# Patient Record
Sex: Male | Born: 1975 | Race: Black or African American | Hispanic: No | Smoking: Never smoker
Health system: Southern US, Community
[De-identification: ages and names within clinical notes are randomized; demographics above are authoritative.]

## PROBLEM LIST (undated history)

## (undated) DIAGNOSIS — K219 Gastro-esophageal reflux disease without esophagitis: Secondary | ICD-10-CM

## (undated) DIAGNOSIS — E291 Testicular hypofunction: Secondary | ICD-10-CM

## (undated) DIAGNOSIS — R059 Cough, unspecified: Secondary | ICD-10-CM

## (undated) DIAGNOSIS — J309 Allergic rhinitis, unspecified: Secondary | ICD-10-CM

## (undated) DIAGNOSIS — J45909 Unspecified asthma, uncomplicated: Secondary | ICD-10-CM

## (undated) DIAGNOSIS — R05 Cough: Secondary | ICD-10-CM

## (undated) DIAGNOSIS — D649 Anemia, unspecified: Secondary | ICD-10-CM

## (undated) DIAGNOSIS — R519 Headache, unspecified: Secondary | ICD-10-CM

## (undated) DIAGNOSIS — G8929 Other chronic pain: Secondary | ICD-10-CM

## (undated) DIAGNOSIS — R0600 Dyspnea, unspecified: Secondary | ICD-10-CM

## (undated) DIAGNOSIS — I10 Essential (primary) hypertension: Secondary | ICD-10-CM

## (undated) DIAGNOSIS — Z79899 Other long term (current) drug therapy: Secondary | ICD-10-CM

## (undated) DIAGNOSIS — N529 Male erectile dysfunction, unspecified: Secondary | ICD-10-CM

## (undated) DIAGNOSIS — R51 Headache: Secondary | ICD-10-CM

## (undated) DIAGNOSIS — E8881 Metabolic syndrome: Secondary | ICD-10-CM

## (undated) HISTORY — PX: EYE SURGERY: SHX253

## (undated) HISTORY — DX: Other chronic pain: G89.29

## (undated) HISTORY — DX: Male erectile dysfunction, unspecified: N52.9

## (undated) HISTORY — DX: Testicular hypofunction: E29.1

## (undated) HISTORY — DX: Headache, unspecified: R51.9

## (undated) HISTORY — DX: Cough, unspecified: R05.9

## (undated) HISTORY — DX: Unspecified asthma, uncomplicated: J45.909

## (undated) HISTORY — DX: Headache: R51

## (undated) HISTORY — DX: Gastro-esophageal reflux disease without esophagitis: K21.9

## (undated) HISTORY — DX: Metabolic syndrome: E88.81

## (undated) HISTORY — DX: Other long term (current) drug therapy: Z79.899

## (undated) HISTORY — DX: Dyspnea, unspecified: R06.00

## (undated) HISTORY — DX: Cough: R05

## (undated) HISTORY — DX: Anemia, unspecified: D64.9

## (undated) HISTORY — DX: Allergic rhinitis, unspecified: J30.9

## (undated) HISTORY — DX: Essential (primary) hypertension: I10

## (undated) HISTORY — DX: Metabolic syndrome: E88.810

## (undated) HISTORY — PX: WISDOM TOOTH EXTRACTION: SHX21

---

## 1999-06-09 ENCOUNTER — Emergency Department (HOSPITAL_COMMUNITY): Admission: EM | Admit: 1999-06-09 | Discharge: 1999-06-09 | Payer: Self-pay | Admitting: Emergency Medicine

## 2014-02-14 ENCOUNTER — Encounter: Payer: Self-pay | Admitting: Critical Care Medicine

## 2014-02-14 ENCOUNTER — Ambulatory Visit (INDEPENDENT_AMBULATORY_CARE_PROVIDER_SITE_OTHER): Payer: Self-pay | Admitting: Critical Care Medicine

## 2014-02-14 VITALS — BP 130/70 | HR 72 | Temp 98.4°F | Ht 68.0 in | Wt 215.0 lb

## 2014-02-14 DIAGNOSIS — J302 Other seasonal allergic rhinitis: Secondary | ICD-10-CM

## 2014-02-14 DIAGNOSIS — K219 Gastro-esophageal reflux disease without esophagitis: Secondary | ICD-10-CM | POA: Insufficient documentation

## 2014-02-14 DIAGNOSIS — R059 Cough, unspecified: Secondary | ICD-10-CM | POA: Insufficient documentation

## 2014-02-14 DIAGNOSIS — R0600 Dyspnea, unspecified: Secondary | ICD-10-CM

## 2014-02-14 DIAGNOSIS — J4541 Moderate persistent asthma with (acute) exacerbation: Secondary | ICD-10-CM

## 2014-02-14 DIAGNOSIS — J309 Allergic rhinitis, unspecified: Secondary | ICD-10-CM | POA: Insufficient documentation

## 2014-02-14 DIAGNOSIS — R05 Cough: Secondary | ICD-10-CM

## 2014-02-14 DIAGNOSIS — J4540 Moderate persistent asthma, uncomplicated: Secondary | ICD-10-CM | POA: Insufficient documentation

## 2014-02-14 MED ORDER — FLUTICASONE PROPIONATE 50 MCG/ACT NA SUSP: 2.0000 | Freq: Every day | NASAL | Status: DC

## 2014-02-14 MED ORDER — MOMETASONE FUROATE 220 MCG/INH IN AEPB: 2.0000 | INHALATION_SPRAY | Freq: Every day | RESPIRATORY_TRACT | Status: DC

## 2014-02-14 MED ORDER — ESOMEPRAZOLE MAGNESIUM 40 MG PO CPDR: 40.0000 mg | DELAYED_RELEASE_CAPSULE | Freq: Every day | ORAL | Status: DC

## 2014-02-14 NOTE — Assessment & Plan Note (Signed)
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

## 2014-02-14 NOTE — Assessment & Plan Note (Signed)
moder persistent allergic rhinitis  Start flonase daily

## 2014-02-14 NOTE — Patient Instructions (Signed)
Start reflux diet  Take nexium 40mg  daily before breakfast Start fluticasone nasal two puff ea nostril daily Start Asmanex two puff daily Return 2 months Send me an MSD sheet on the  plastic material with which you do your work

## 2014-02-14 NOTE — Progress Notes (Signed)
Subjective:    Patient ID: Brad Barnett, male    DOB: [birth year], 38 y.o.   MRN: [id number]  HPI Comments: Chronic cough x 5 months. Pcp rx abx and steroids.  No real improvement.  Maybe sl better with cooler weather.  Works indoors in [employer] 500 degrees heater, dust and fumes.  Pt inhales plastic fumes. Works there x 15 yrs.  Cough This is a recurrent (cough comes and goes x 69yrs, worse this year x 5 months) problem. The current episode started more than 1 month ago. The problem has been gradually improving (with cooler weather). Episode frequency: now coughs at night, before was coughing every few minutes including daytime. The cough is non-productive. Associated symptoms include eye redness, headaches, heartburn, a sore throat, shortness of breath and wheezing. Pertinent negatives include no chest pain, chills, ear congestion, ear pain, fever, hemoptysis, myalgias, nasal congestion, postnasal drip, rash, rhinorrhea or weight loss. Associated symptoms comments: Notes some chest tightness. The symptoms are aggravated by fumes, exercise and lying down (when took a week off no cough at all, worse at night). Risk factors for lung disease include occupational exposure. He has tried oral steroids (abx ) for the symptoms. The treatment provided no relief. There is no history of asthma, bronchiectasis, bronchitis, COPD, emphysema, environmental allergies or pneumonia.    Past Medical History  Diagnosis Date  . Allergic rhinitis   . ED (erectile dysfunction)   . GERD (gastroesophageal reflux disease)   . Headache   . High risk medication use   . Hypogonadism male   . Metabolic syndrome   . Cough   . Dyspnea      Family History  Problem Relation Age of Onset  . Heart disease Father   . Breast cancer Paternal Aunt   . Hypertension Mother   . Diabetes Paternal Grandmother      History   Social History  . Marital Status: Married    Spouse Name: N/A    Number of Children: N/A    . Years of Education: N/A   Occupational History  . Process Music therapist   Social History Main Topics  . Smoking status: Never Smoker   . Smokeless tobacco: Never Used  . Alcohol Use: No  . Drug Use: No  . Sexual Activity: Not on file   Other Topics Concern  . Not on file   Social History Narrative  . No narrative on file     No Known Allergies   No outpatient prescriptions prior to visit.   No facility-administered medications prior to visit.      Review of Systems  Constitutional: Positive for fatigue. Negative for fever, chills and weight loss.  HENT: Positive for sneezing and sore throat. Negative for congestion, dental problem, drooling, ear pain, mouth sores, nosebleeds, postnasal drip, rhinorrhea, sinus pressure, trouble swallowing and voice change.   Eyes: Positive for redness. Negative for pain and itching.  Respiratory: Positive for cough, chest tightness, shortness of breath and wheezing. Negative for apnea, hemoptysis, choking and stridor.   Cardiovascular: Negative for chest pain and palpitations.  Gastrointestinal: Positive for heartburn.       Notes heart burn daily  Musculoskeletal: Negative for myalgias.  Skin: Negative for rash.  Allergic/Immunologic: Negative for environmental allergies.  Neurological: Positive for headaches.       Objective:   Physical Exam Filed Vitals:   02/14/14 0921  BP: 130/70  Pulse: 72  Temp: 98.4 F (36.9 C)  TempSrc: Oral  Height: 5\' 8"  (1.727 m)  Weight: 215 lb (97.523 kg)  SpO2: 97%    Gen: Pleasant, well-nourished, in no distress,  normal affect  ENT: No lesions,  mouth clear,  oropharynx clear, ++postnasal drip, nasal inflammation.    Neck: No JVD, no TMG, no carotid bruits  Lungs: No use of accessory muscles, no dullness to percussion, expir wheeze  Cardiovascular: RRR, heart sounds normal, no murmur or gallops, no peripheral edema  Abdomen: soft and NT, no HSM,  BS  normal  Musculoskeletal: No deformities, no cyanosis or clubbing  Neuro: alert, non focal  Skin: Warm, no lesions or rashes  No results found.  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 esomeprazole (NEXIUM) 40 MG capsule Take 1 capsule (40 mg total) by mouth daily before breakfast.  . fluticasone (FLONASE) 50 MCG/ACT nasal spray Place 2 sprays into both nostrils daily.  . mometasone (ASMANEX 120 METERED DOSES) 220 MCG/INH inhaler Inhale 2 puffs into the lungs daily.  . [DISCONTINUED] esomeprazole (NEXIUM) 40 MG capsule Take 1 capsule by mouth daily as needed.

## 2014-03-27 ENCOUNTER — Encounter: Payer: Self-pay | Admitting: Critical Care Medicine

## 2014-04-11 ENCOUNTER — Ambulatory Visit: Payer: Self-pay | Admitting: Critical Care Medicine

## 2014-05-17 ENCOUNTER — Ambulatory Visit (INDEPENDENT_AMBULATORY_CARE_PROVIDER_SITE_OTHER): Payer: Self-pay | Admitting: Critical Care Medicine

## 2014-05-17 ENCOUNTER — Encounter: Payer: Self-pay | Admitting: Critical Care Medicine

## 2014-05-17 VITALS — BP 134/86 | HR 72 | Temp 99.1°F | Ht 68.5 in | Wt 215.2 lb

## 2014-05-17 DIAGNOSIS — J4540 Moderate persistent asthma, uncomplicated: Secondary | ICD-10-CM

## 2014-05-17 NOTE — Progress Notes (Signed)
Subjective:    Patient ID: Brad Barnett, male    DOB: February 12, 1976, 39 y.o.   MRN: [id number]  HPI Comments: Chronic cough x 5 months. Pcp rx abx and steroids.  No real improvement.  Maybe sl better with cooler weather.  Works indoors in [employer] 500 degrees heater, dust and fumes.  Pt inhales plastic fumes. Works there x 15 yrs.  Cough Pertinent negatives include no chest pain, chills, ear pain, eye redness, fever, headaches, myalgias, postnasal drip, rash, rhinorrhea, sore throat, shortness of breath or wheezing. There is no history of environmental allergies.   05/17/2014 Chief Complaint  Patient presents with  . 2 month follow up    Overall, breathing doing well at this time.  Cough has resolved.  No SOB, wheezing, or chest tightness/CP.  Overall this patient's markedly better since the last office visit. The patient maintains Asmanex daily. Pt notes mild dyspnea. Cough is better.  Pt did have a 3 week episode around thanksgiving.  Pt on nexium and asmanex , also using nasal spray Pt denies any significant sore throat, nasal congestion or excess secretions, fever, chills, sweats, unintended weight loss, pleurtic or exertional chest pain, orthopnea PND, or leg swelling Pt denies any increase in rescue therapy over baseline, denies waking up needing it or having any early am or nocturnal exacerbations of coughing/wheezing/or dyspnea. Pt also denies any obvious fluctuation in symptoms with  weather or environmental change or other alleviating or aggravating factors  PUL ASTHMA HISTORY 05/17/2014  Symptoms 0-2 days/week  Nighttime awakenings 3-4/month  Interference with activity Minor limitations  SABA use 0-2 days/wk  Exacerbations requiring oral steroids 0-1 / year     Review of Systems  Constitutional: Negative for fever, chills and fatigue.  HENT: Negative for congestion, dental problem, drooling, ear pain, mouth sores, nosebleeds, postnasal drip, rhinorrhea, sinus  pressure, sneezing, sore throat, trouble swallowing and voice change.   Eyes: Negative for pain, redness and itching.  Respiratory: Negative for apnea, cough, choking, chest tightness, shortness of breath, wheezing and stridor.   Cardiovascular: Negative for chest pain and palpitations.  Gastrointestinal:       Heartburn has improved  Musculoskeletal: Negative for myalgias.  Skin: Negative for rash.  Allergic/Immunologic: Negative for environmental allergies.  Neurological: Negative for headaches.       Objective:   Physical Exam Filed Vitals:   05/17/14 0951  BP: 134/86  Pulse: 72  Temp: 99.1 F (37.3 C)  TempSrc: Oral  Height: 5' 8.5" (1.74 m)  Weight: 215 lb 3.2 oz (97.614 kg)  SpO2: 99%    Gen: Pleasant, well-nourished, in no distress,  normal affect  ENT: No lesions,  mouth clear,  oropharynx clear, - postnasal drip,no nasal inflammation.    Neck: No JVD, no TMG, no carotid bruits  Lungs: No use of accessory muscles, no dullness to percussion, no wheezing  Cardiovascular: RRR, heart sounds normal, no murmur or gallops, no peripheral edema  Abdomen: soft and NT, no HSM,  BS normal  Musculoskeletal: No deformities, no cyanosis or clubbing  Neuro: alert, non focal  Skin: Warm, no lesions or rashes  No results found.      Assessment & Plan:   Asthma, moderate persistent Moderate persistent asthma with improved airway function with use of inhaled steroid and control of reflux Plan Maintain Asmanex daily Maintain Nexium daily Continued short acting beta agonist as needed Continue nasal steroid     Updated Medication List Outpatient Encounter Prescriptions as of 05/17/2014  Medication Sig  . esomeprazole (NEXIUM) 40 MG capsule Take 1 capsule (40 mg total) by mouth daily before breakfast.  . fluticasone (FLONASE) 50 MCG/ACT nasal spray Place 2 sprays into both nostrils daily.  . mometasone (ASMANEX 120 METERED DOSES) 220 MCG/INH inhaler Inhale 2 puffs  into the lungs daily.

## 2014-05-17 NOTE — Patient Instructions (Signed)
Stay on asmanex , nexium and flonase Return 4 months

## 2014-05-18 NOTE — Assessment & Plan Note (Signed)
Moderate persistent asthma with improved airway function with use of inhaled steroid and control of reflux Plan Maintain Asmanex daily Maintain Nexium daily Continued short acting beta agonist as needed Continue nasal steroid

## 2014-05-30 ENCOUNTER — Ambulatory Visit: Payer: Self-pay | Admitting: Critical Care Medicine

## 2015-04-04 HISTORY — PX: COLONOSCOPY: SHX174

## 2016-02-21 ENCOUNTER — Ambulatory Visit: Payer: Self-pay | Admitting: Adult Health

## 2016-02-26 ENCOUNTER — Ambulatory Visit (INDEPENDENT_AMBULATORY_CARE_PROVIDER_SITE_OTHER): Payer: BLUE CROSS/BLUE SHIELD | Admitting: Adult Health

## 2016-02-26 ENCOUNTER — Other Ambulatory Visit (INDEPENDENT_AMBULATORY_CARE_PROVIDER_SITE_OTHER): Payer: BLUE CROSS/BLUE SHIELD

## 2016-02-26 ENCOUNTER — Encounter: Payer: Self-pay | Admitting: Adult Health

## 2016-02-26 ENCOUNTER — Ambulatory Visit (INDEPENDENT_AMBULATORY_CARE_PROVIDER_SITE_OTHER)
Admission: RE | Admit: 2016-02-26 | Discharge: 2016-02-26 | Disposition: A | Discharge status: Home / Self Care | Payer: BLUE CROSS/BLUE SHIELD | Source: Ambulatory Visit | Attending: Adult Health | Admitting: Adult Health

## 2016-02-26 VITALS — BP 130/78 | HR 84 | Temp 98.1°F | Ht 69.0 in | Wt 214.6 lb

## 2016-02-26 DIAGNOSIS — R05 Cough: Secondary | ICD-10-CM | POA: Diagnosis not present

## 2016-02-26 DIAGNOSIS — J4541 Moderate persistent asthma with (acute) exacerbation: Secondary | ICD-10-CM

## 2016-02-26 DIAGNOSIS — R059 Cough, unspecified: Secondary | ICD-10-CM

## 2016-02-26 LAB — CBC WITH DIFFERENTIAL/PLATELET
BASOS ABS: 0 10*3/uL (ref 0.0–0.1)
Basophils Relative: 0.3 % (ref 0.0–3.0)
EOS PCT: 0.2 % (ref 0.0–5.0)
Eosinophils Absolute: 0 10*3/uL (ref 0.0–0.7)
HCT: 42.8 % (ref 39.0–52.0)
HEMOGLOBIN: 14.4 g/dL (ref 13.0–17.0)
LYMPHS PCT: 12.4 % (ref 12.0–46.0)
Lymphs Abs: 1.1 10*3/uL (ref 0.7–4.0)
MCHC: 33.6 g/dL (ref 30.0–36.0)
MCV: 94.8 fl (ref 78.0–100.0)
Monocytes Absolute: 0.9 10*3/uL (ref 0.1–1.0)
Monocytes Relative: 9.9 % (ref 3.0–12.0)
Neutro Abs: 6.7 10*3/uL (ref 1.4–7.7)
Neutrophils Relative %: 77.2 % — ABNORMAL HIGH (ref 43.0–77.0)
Platelets: 208 10*3/uL (ref 150.0–400.0)
RBC: 4.52 Mil/uL (ref 4.22–5.81)
RDW: 12.9 % (ref 11.5–15.5)
WBC: 8.7 10*3/uL (ref 4.0–10.5)

## 2016-02-26 LAB — NITRIC OXIDE: NITRIC OXIDE: 14

## 2016-02-26 MED ORDER — MOMETASONE FUROATE 220 MCG/INH IN AEPB: 2.0000 | INHALATION_SPRAY | Freq: Every day | RESPIRATORY_TRACT | 4 refills | Status: DC

## 2016-02-26 NOTE — Patient Instructions (Addendum)
Restart Asmanex daily rinse well after use.  Begin Zyrtec 10mg  At bedtime  .  Begin Delsym 2 tsp Twice daily  As needed  Cough.  Begin Pepcid 20mg  At bedtime  .  Check chest xray and labs today  Follow up Dr. [name]  In 6 weeks with PFT and As needed   Please contact office for sooner follow up if symptoms do not improve or worsen or seek emergency care

## 2016-02-26 NOTE — Progress Notes (Signed)
Subjective:    Patient ID: Brad Barnett, male    DOB: [birth year], 40 y.o.   MRN: [id number]  HPI 40 year old male never smoker followed for moderate persistent asthma with chronic cough.  02/26/2016 Acute OV  Patient presents for an acute office visit. Patient complains over the last 3-4 weeks that he's had increased cough that is mainly dry. His last seen in the office in greater than 1-1/2 years ago. Previously treated with Asmanex and Nexium /Flonase.  Says cough got better but never went away totally . Has been off asmanex x 3 months  Not using any cough meds  FeNO 14 today .  Previous spirometry normal in 2015.  He denies any chest pain, orthopnea, PND, leg swelling, hemoptysis, rash, abdominal pain, nausea, vomiting, diarrhea, or overt reflux   Past Medical History:  Diagnosis Date  . Allergic rhinitis   . Cough   . Dyspnea   . ED (erectile dysfunction)   . GERD (gastroesophageal reflux disease)   . Headache   . High risk medication use   . Hypogonadism male   . Metabolic syndrome    Current Outpatient Prescriptions on File Prior to Visit  Medication Sig Dispense Refill  . esomeprazole (NEXIUM) 40 MG capsule Take 1 capsule (40 mg total) by mouth daily before breakfast. 30 capsule 6  . fluticasone (FLONASE) 50 MCG/ACT nasal spray Place 2 sprays into both nostrils daily. (Patient not taking: Reported on 02/26/2016) 16 g 6  . mometasone (ASMANEX 120 METERED DOSES) 220 MCG/INH inhaler Inhale 2 puffs into the lungs daily. (Patient not taking: Reported on 02/26/2016) 1 Inhaler 12   No current facility-administered medications on file prior to visit.      Review of Systems Constitutional:   No  weight loss, night sweats,  Fevers, chills, fatigue, or  lassitude.  HEENT:   No headaches,  Difficulty swallowing,  Tooth/dental problems, or  Sore throat,                No sneezing, itching, ear ache, + nasal congestion, post nasal drip,   CV:  No chest pain,  Orthopnea,  PND, swelling in lower extremities, anasarca, dizziness, palpitations, syncope.   GI  No heartburn, indigestion, abdominal pain, nausea, vomiting, diarrhea, change in bowel habits, loss of appetite, bloody stools.   Resp:   No chest wall deformity  Skin: no rash or lesions.  GU: no dysuria, change in color of urine, no urgency or frequency.  No flank pain, no hematuria   MS:  No joint pain or swelling.  No decreased range of motion.  No back pain.  Psych:  No change in mood or affect. No depression or anxiety.  No memory loss.         Objective:   Physical Exam Vitals:   02/26/16 1612  BP: 130/78  Pulse: 84  Temp: 98.1 F (36.7 C)  TempSrc: Oral  SpO2: 98%  Weight: 214 lb 9.6 oz (97.3 kg)  Height: 5\' 9"  (1.753 m)   GEN: A/Ox3; pleasant , NAD, well nourished    HEENT:  [place]/AT,  EACs-clear, TMs-wnl, NOSE-clear drainage , THROAT-clear, no lesions, no postnasal drip or exudate noted.   NECK:  Supple w/ fair ROM; no JVD; normal carotid impulses w/o bruits; no thyromegaly or nodules palpated; no lymphadenopathy.    RESP  Clear  P & A; w/o, wheezes/ rales/ or rhonchi. no accessory muscle use, no dullness to percussion  CARD:  RRR, no m/r/g  , no  peripheral edema, pulses intact, no cyanosis or clubbing.  GI:   Soft & nt; nml bowel sounds; no organomegaly or masses detected.   Musco: Warm bil, no deformities or joint swelling noted.   Neuro: alert, no focal deficits noted.    Skin: Warm, no lesions or rashes  Tammy Parrett NP-C  [facility]  02/26/2016        Assessment & Plan:

## 2016-02-27 LAB — RESPIRATORY ALLERGY PROFILE REGION II ~~LOC~~
Allergen, A. alternata, m6: <0.1 kU/L
Allergen, C. Herbarum, M2: <0.1 kU/L
Allergen, Comm Silver Birch, t9: <0.1 kU/L
Allergen, Cottonwood, t14: <0.1 kU/L
Allergen, D pternoyssinus,d7: 3.06 kU/L — ABNORMAL HIGH
Allergen, Mulberry, t76: <0.1 kU/L
Allergen, P. notatum, m1: <0.1 kU/L
Bermuda Grass: <0.1 kU/L
Cockroach: <0.1 kU/L
Common Ragweed: <0.1 kU/L
D. FARINAE: 3.05 kU/L — AB
Dog Dander: <0.1 kU/L
Elm IgE: <0.1 kU/L
IgE (Immunoglobulin E), Serum: 86 kU/L (ref <115)
Pecan/Hickory Tree IgE: <0.1 kU/L
Rough Pigweed  IgE: <0.1 kU/L

## 2016-02-28 NOTE — Progress Notes (Signed)
Chart and office note reviewed in detail  > agree with a/p as outlined    

## 2016-02-28 NOTE — Assessment & Plan Note (Signed)
Cough flare ? AR/GERD triggers  Check cxr Reva Bores w/ IgE and RAST   Plan  Patient Instructions  Restart Asmanex daily rinse well after use.  Begin Zyrtec 10mg  At bedtime  .  Begin Delsym 2 tsp Twice daily  As needed  Cough.  Begin Pepcid 20mg  At bedtime  .  Check chest xray and labs today  Follow up Dr. [name]  In 6 weeks with PFT and As needed   Please contact office for sooner follow up if symptoms do not improve or worsen or seek emergency care

## 2016-02-28 NOTE — Assessment & Plan Note (Signed)
Mild flare -check IgE /RAST  FeNO low  Prev spirometry nml   Plan  Patient Instructions  Restart Asmanex daily rinse well after use.  Begin Zyrtec 10mg  At bedtime  .  Begin Delsym 2 tsp Twice daily  As needed  Cough.  Begin Pepcid 20mg  At bedtime  .  Check chest xray and labs today  Follow up Dr. [name]  In 6 weeks with PFT and As needed   Please contact office for sooner follow up if symptoms do not improve or worsen or seek emergency care

## 2016-04-16 ENCOUNTER — Other Ambulatory Visit: Payer: Self-pay | Admitting: Internal Medicine

## 2016-04-16 ENCOUNTER — Ambulatory Visit: Payer: BLUE CROSS/BLUE SHIELD | Admitting: Internal Medicine

## 2016-04-16 DIAGNOSIS — R0600 Dyspnea, unspecified: Secondary | ICD-10-CM

## 2016-05-19 ENCOUNTER — Ambulatory Visit: Payer: BLUE CROSS/BLUE SHIELD | Admitting: Internal Medicine

## 2016-06-11 ENCOUNTER — Ambulatory Visit: Payer: BLUE CROSS/BLUE SHIELD | Admitting: Internal Medicine

## 2016-07-15 ENCOUNTER — Ambulatory Visit (INDEPENDENT_AMBULATORY_CARE_PROVIDER_SITE_OTHER): Payer: BLUE CROSS/BLUE SHIELD | Admitting: Internal Medicine

## 2016-07-15 ENCOUNTER — Encounter: Payer: Self-pay | Admitting: Internal Medicine

## 2016-07-15 ENCOUNTER — Ambulatory Visit (INDEPENDENT_AMBULATORY_CARE_PROVIDER_SITE_OTHER)
Admission: RE | Admit: 2016-07-15 | Discharge: 2016-07-15 | Disposition: A | Discharge status: Home / Self Care | Payer: BLUE CROSS/BLUE SHIELD | Source: Ambulatory Visit | Attending: Internal Medicine | Admitting: Internal Medicine

## 2016-07-15 VITALS — BP 140/90 | HR 85 | Temp 98.2°F | Ht 69.0 in | Wt 209.8 lb

## 2016-07-15 DIAGNOSIS — R05 Cough: Secondary | ICD-10-CM

## 2016-07-15 DIAGNOSIS — R059 Cough, unspecified: Secondary | ICD-10-CM

## 2016-07-15 MED ORDER — PREDNISONE 10 MG PO TABS: ORAL_TABLET | ORAL | 0 refills | Status: DC

## 2016-07-15 MED ORDER — FAMOTIDINE 20 MG PO TABS: ORAL_TABLET | ORAL | 2 refills | Status: DC

## 2016-07-15 MED ORDER — AMOXICILLIN-POT CLAVULANATE 875-125 MG PO TABS: 1.0000 | ORAL_TABLET | Freq: Two times a day (BID) | ORAL | 0 refills | Status: DC

## 2016-07-15 MED ORDER — TRAMADOL HCL 50 MG PO TABS: ORAL_TABLET | ORAL | 0 refills | Status: DC

## 2016-07-15 NOTE — Patient Instructions (Addendum)
Augmentin 875 mg take one pill twice daily  X 10 days - take at breakfast and supper with large glass of water.  It would help reduce the usual side effects (diarrhea and yeast infections) if you ate cultured yogurt at lunch   - sinus CT needs to be done in 10 days - no sooner so we will call to schedule this  Prednisone 10 mg take  4 each am x 2 days,   2 each am x 2 days,  1 each am x 2 days and stop   Nexium  40 mg   Take  30-60 min before first meal of the day and Pepcid (famotidine)  20 mg one @  bedtime until return to office - this is the best way to tell whether stomach acid is contributing to your problem.    GERD (REFLUX)  is an extremely common cause of respiratory symptoms just like yours , many times with no obvious heartburn at all.    It can be treated with medication, but also with lifestyle changes including elevation of the head of your bed (ideally with 6 inch  bed blocks),  Smoking cessation, avoidance of late meals, excessive alcohol, and avoid fatty foods, chocolate, peppermint, colas, red wine, and acidic juices such as orange juice.  NO MINT OR MENTHOL PRODUCTS SO NO COUGH DROPS   USE SUGARLESS CANDY INSTEAD (Jolley ranchers or Stover's or Life Savers) or even ice chips will also do - the key is to swallow to prevent all throat clearing. NO OIL BASED VITAMINS - use powdered substitutes.   Take delsym two tsp every 12 hours and supplement if needed with  tramadol 50 mg up to 2 every 4 hours to suppress the urge to cough. Swallowing water or using ice chips/non mint and menthol containing candies (such as lifesavers or sugarless jolly ranchers) are also effective.  You should rest your voice and avoid activities that you know make you cough.  Once you have eliminated the cough for 3 straight days try reducing the tramadol first,  then the delsym as tolerated.    Please schedule a follow up office visit in 4 weeks, sooner if needed  with all medications /inhalers/ solutions in  hand so we can verify exactly what you are taking. This includes all medications from all doctors and over the counters

## 2016-07-15 NOTE — Progress Notes (Signed)
Subjective:   Patient ID: Brad Barnett, male    DOB: [birth year]    MRN: [id number]    Brief patient profile:  41 year old male never smoker with chronic cough onset mid 68's prev dx as asthma but minimally responsive to pred/ ICS  History of Present Illness  02/26/2016 NP  Acute OV  Patient presents for an acute office visit. Patient complains over the last 3-4 weeks that he's had increased cough that is mainly dry. His last seen in the office in greater than 1-1/2 years ago. Previously treated with Asmanex and Nexium /Flonase.  Says cough got better but never went away totally . Has been off asmanex x 3 months  Not using any cough meds  FeNO 14 .  Previous spirometry normal in 2015 rec Restart Asmanex daily rinse well after use.  Begin Zyrtec 10mg  At bedtime  .  Begin Delsym 2 tsp Twice daily  As needed  Cough.  Begin Pepcid 20mg  At bedtime  .  Check chest xray and labs today  Follow up Dr. [name]  In 6 weeks with PFT and As needed     07/15/2016  Acute extended  ov/Maxima Skelton re:  Chief Complaint  Patient presents with  . Acute Visit    Pt c/o increased cough for the past month. Cough is prod with green sputum. He states he had hemoptysis x 3 wks ago and PCP put him on Doxy and he has 1 dose left. He states also having SOB since he has been coughing and has had HA's.   cough coming and going x  2010 can go away for months at a time regardless of rx this episode started Sept 2018 Cough is usually dry but turned productive x 3 weeks and even p doxy still green   Mostly just sob when coughing  In retrospect he insists that nothing he has ever taken ameliorates the cough, it just goes away p several months on its own      Kouffman Reflux v Neurogenic Cough Differentiator Reflux Comments  Do you awaken from a sound sleep coughing violently?                            With trouble breathing? Much worse   Do you have choking episodes when you cannot  Get enough air, gasping for  air ?              Yes   Do you usually cough when you lie down into  The bed, or when you just lie down to rest ?                          Yes every night   Do you usually cough after meals or eating?         No    Do you cough when (or after) you bend over?    No    GERD SCORE     Kouffman Reflux v Neurogenic Cough Differentiator Neurogenic   Do you more-or-less cough all day long? yes   Does change of temperature make you cough? sometimes   Does laughing or chuckling cause you to cough? no   Do fumes (perfume, automobile fumes, burned  Toast, etc.,) cause you to cough ?      no   Does speaking, singing, or talking on the phone cause you to cough   ?  Yes    Neurogenic/Airway score      No obvious other patterns in  day to day or daytime variability or assoc  Production  mucus plugs or further  hemoptysis or cp or chest tightness, subjective wheeze or overt   hb symptoms. No unusual exp hx or h/o childhood pna/ asthma or knowledge of premature birth.   Also denies any obvious fluctuation of symptoms with weather or environmental changes or other aggravating or alleviating factors except as outlined above   Current Medications, Allergies, Complete Past Medical History, Past Surgical History, Family History, and Social History were reviewed in [employer] record.  ROS  The following are not active complaints unless bolded sore throat, dysphagia, dental problems, itching, sneezing,  nasal congestion or excess/ purulent secretions, ear ache,   fever, chills, sweats, unintended wt loss, classically pleuritic or exertional cp,  orthopnea pnd or leg swelling, presyncope, palpitations, abdominal pain, anorexia, nausea, vomiting, diarrhea  or change in bowel or bladder habits, change in stools or urine, dysuria,hematuria,  rash, arthralgias, visual complaints, headache, numbness, weakness or ataxia or problems with walking or coordination,  change in mood/affect or  memory.                 Objective:   Physical Exam    amb bm somewhat somber/ hopeless affect and harsh barking /grinding upper airway pattern cough   Wt Readings from Last 3 Encounters:  07/15/16 209 lb 12.8 oz (95.2 kg)  02/26/16 214 lb 9.6 oz (97.3 kg)  05/17/14 215 lb 3.2 oz (97.6 kg)    Vital signs reviewed - - Note on arrival 02 sats  98% on RA   HEENT: nl dentition, turbinates bilaterally, and oropharynx. Nl external ear canals without cough reflex   NECK :  without JVD/Nodes/TM/ nl carotid upstrokes bilaterally   LUNGS: no acc muscle use,  Nl contour chest which is clear to A and P bilaterally without cough on insp or exp maneuvers   CV:  RRR  no s3 or murmur or increase in P2, and no edema   ABD:  soft and nontender with nl inspiratory excursion in the supine position. No bruits or organomegaly appreciated, bowel sounds nl  MS:  Nl gait/ ext warm without deformities, calf tenderness, cyanosis or clubbing No obvious joint restrictions   SKIN: warm and dry without lesions    NEURO:  alert, approp, nl sensorium with  no motor or cerebellar deficits apparent.    CXR PA and Lateral:   07/15/2016 :    I personally reviewed images and agree with radiology impression as follows:   Decreased lung volumes, no acute changes      Assessment & Plan:

## 2016-07-16 ENCOUNTER — Encounter: Payer: Self-pay | Admitting: Internal Medicine

## 2016-07-16 NOTE — Assessment & Plan Note (Signed)
02/14/2014 Spiro: normal 02/26/2016 Ige 85  RAST pos dust only and Eos 0.0  - FENO 02/26/16  14  Off all rx during flare of cough  - 2/95/2841 cyclical cough regimen   The most common causes of chronic cough in immunocompetent adults include the following: upper airway cough syndrome (UACS), previously referred to as postnasal drip syndrome (PNDS), which is caused by variety of rhinosinus conditions; (2) asthma; (3) GERD; (4) chronic bronchitis from cigarette smoking or other inhaled environmental irritants; (5) nonasthmatic eosinophilic bronchitis; and (6) bronchiectasis.   These conditions, singly or in combination, have accounted for up to 94% of the causes of chronic cough in prospective studies.   Other conditions have constituted no >6% of the causes in prospective studies These have included bronchogenic carcinoma, chronic interstitial pneumonia, sarcoidosis, left ventricular failure, ACEI-induced cough, and aspiration from a condition associated with pharyngeal dysfunction.    Chronic cough is often simultaneously caused by more than one condition. A single cause has been found from 38 to 82% of the time, multiple causes from 18 to 62%. Multiply caused cough has been the result of three diseases up to 42% of the time.       Most likely this is not asthma at all but rather Upper airway cough syndrome (previously labeled PNDS) , is  so named because it's frequently impossible to sort out how much is  CR/sinusitis with freq throat clearing (which can be related to primary GERD)   vs  causing  secondary (" extra esophageal")  GERD from wide swings in gastric pressure that occur with throat clearing, often  promoting self use of mint and menthol lozenges that reduce the lower esophageal sphincter tone and exacerbate the problem further in a cyclical fashion.   These are the same pts (now being labeled as having "irritable larynx syndrome" by some cough centers) who not infrequently have a history  of having failed to tolerate ace inhibitors,  dry powder inhalers or biphosphonates or report having atypical/extraesophageal reflux symptoms that don't respond to standard doses of PPI  and are easily confused as having aecopd or asthma flares by even experienced allergists/ pulmonologists (myself included).  Of the three most common causes of  Sub-acute or recurrent or chronic cough, only one (GERD)  can actually contribute to/ trigger  the other two (asthma and post nasal drip syndrome)  and perpetuate the cylce of cough.  While not intuitively obvious, many patients with chronic low grade reflux do not cough until there is a primary insult that disturbs the protective epithelial barrier and exposes sensitive nerve endings.   This is typically viral but can be direct physical injury such as with an endotracheal tube.   The point is that once this occurs, it is difficult to eliminate the cycle  using anything but a maximally effective acid suppression regimen at least in the short run, accompanied by an appropriate diet to address non acid GERD and control / eliminate the cough itself for at least 3 days.   rec max rx for cyclical cough with pred x 6 days/ augmentin for possible sinus dz and tramadol to suppress cough while miant on gerd rx until returns  Discussed with pt The standardized cough guidelines published in Chest by Lissa Morales in 2006 are still the best available and consist of a multiple step process (up to 12!) , not a single office visit,  and are intended  to address this problem logically,  with an alogrithm dependent on  response to empiric treatment at  each progressive step  to determine a specific diagnosis with  minimal addtional testing needed. Therefore if adherence is an issue or can't be accurately verified,  it's very unlikely the standard evaluation and treatment will be successful here.    Furthermore, response to therapy (other than acute cough suppression, which should  only be used short term with avoidance of narcotic containing cough syrups if possible), can be a gradual process for which the patient is not likely to  perceive immediate benefit.  Unlike going to an eye doctor where the best perscription is almost always the first one and is immediately effective, this is almost never the case in the management of chronic cough syndromes. Therefore the patient needs to commit up front to consistently adhere to recommendations  for up to 6 weeks of therapy directed at the likely underlying problem(s) before the response can be reasonably evaluated.   So next step is to return in 4 weeks with all meds in hand using a trust but verify approach to confirm accurate Medication  Reconciliation The principal here is that until we are certain that the  patients are doing what we've asked, it makes no sense to ask them to do more.    I had an extended discussion with the patient  And fiancee reviewing all relevant studies completed to date and  lasting 25 minutes of a 40  minute acute office visit with pt not previously known to me with      non-specific but potentially very serious refractory respiratory symptoms of unknown etiology.  Each maintenance medication was reviewed in detail including most importantly the difference between maintenance and prns and under what circumstances the prns are to be triggered using an action plan format that is not reflected in the computer generated alphabetically organized AVS.    Please see AVS for specific instructions unique to this office visit that I personally wrote and verbalized to the the pt in detail and then reviewed with pt  by my nurse highlighting any changes in therapy/plan of care  recommended at today's visit.

## 2016-07-16 NOTE — Progress Notes (Signed)
Spoke with pt and notified of results per Dr. Wert. Pt verbalized understanding and denied any questions. 

## 2016-07-16 NOTE — Progress Notes (Signed)
LMTCB

## 2016-07-22 ENCOUNTER — Ambulatory Visit (INDEPENDENT_AMBULATORY_CARE_PROVIDER_SITE_OTHER)
Admission: RE | Admit: 2016-07-22 | Discharge: 2016-07-22 | Disposition: A | Discharge status: Home / Self Care | Payer: BLUE CROSS/BLUE SHIELD | Source: Ambulatory Visit | Attending: Internal Medicine | Admitting: Internal Medicine

## 2016-07-22 DIAGNOSIS — R059 Cough, unspecified: Secondary | ICD-10-CM

## 2016-07-22 DIAGNOSIS — R05 Cough: Secondary | ICD-10-CM | POA: Diagnosis not present

## 2016-07-23 ENCOUNTER — Other Ambulatory Visit: Payer: Self-pay

## 2016-07-23 DIAGNOSIS — R05 Cough: Secondary | ICD-10-CM

## 2016-07-23 DIAGNOSIS — R059 Cough, unspecified: Secondary | ICD-10-CM

## 2016-07-23 DIAGNOSIS — J329 Chronic sinusitis, unspecified: Secondary | ICD-10-CM

## 2016-07-23 MED ORDER — AMOXICILLIN-POT CLAVULANATE 875-125 MG PO TABS: 1.0000 | ORAL_TABLET | Freq: Two times a day (BID) | ORAL | 0 refills | Status: AC

## 2016-08-05 ENCOUNTER — Other Ambulatory Visit: Payer: BLUE CROSS/BLUE SHIELD

## 2016-08-11 ENCOUNTER — Inpatient Hospital Stay: Admission: RE | Admit: 2016-08-11 | Payer: BLUE CROSS/BLUE SHIELD | Source: Ambulatory Visit

## 2016-08-15 ENCOUNTER — Ambulatory Visit (INDEPENDENT_AMBULATORY_CARE_PROVIDER_SITE_OTHER): Payer: BLUE CROSS/BLUE SHIELD | Admitting: Internal Medicine

## 2016-08-15 ENCOUNTER — Encounter: Payer: Self-pay | Admitting: Internal Medicine

## 2016-08-15 VITALS — BP 122/80 | HR 74 | Ht 69.0 in | Wt 207.0 lb

## 2016-08-15 DIAGNOSIS — R05 Cough: Secondary | ICD-10-CM | POA: Diagnosis not present

## 2016-08-15 DIAGNOSIS — R059 Cough, unspecified: Secondary | ICD-10-CM

## 2016-08-15 NOTE — Progress Notes (Signed)
Subjective:   Patient ID: Brad Barnett, male    DOB: October 28, 1975    MRN: [id number]    Brief patient profile:  41 year old male never smoker with chronic cough onset mid 64's prev dx as asthma but minimally responsive to pred/ ICS    History of Present Illness  02/26/2016 NP  Acute OV  Patient presents for an acute office visit. Patient complains over the last 3-4 weeks that he's had increased cough that is mainly dry. His last seen in the office in greater than 1-1/2 years ago. Previously treated with Asmanex and Nexium /Flonase.  Says cough got better but never went away totally . Has been off asmanex x 3 months  Not using any cough meds  FeNO 14 .  Previous spirometry normal in 2015 rec Restart Asmanex daily rinse well after use.  Begin Zyrtec 10mg  At bedtime  .  Begin Delsym 2 tsp Twice daily  As needed  Cough.  Begin Pepcid 20mg  At bedtime  .  Check chest xray and labs today  Follow up Dr. [name]  In 6 weeks with PFT and As needed     07/15/2016  Acute extended  ov/[name] re:  Recurrent cough  X 2010 with months in between of no cough in between  Chief Complaint  Patient presents with  . Acute Visit    Pt c/o increased cough for the past month. Cough is prod with green sputum. He states he had hemoptysis x 3 wks ago and PCP put him on Doxy and he has 1 dose left. He states also having SOB since he has been coughing and has had HA's.   cough coming and going x  2010 can go away for months at a time regardless of rx this episode started Sept 2017 Cough is usually dry but turned productive x 3 weeks and even p doxy still green   Mostly just sob when coughing  In retrospect he insists that nothing he has ever taken ameliorates the cough, it just goes away p several months on its own   Kouffman Reflux v Neurogenic Cough Differentiator Reflux Comments  Do you awaken from a sound sleep coughing violently?                            With trouble breathing? Much worse   Do you  have choking episodes when you cannot  Get enough air, gasping for air ?              Yes   Do you usually cough when you lie down into  The bed, or when you just lie down to rest ?                          Yes every night   Do you usually cough after meals or eating?         No    Do you cough when (or after) you bend over?    No    GERD SCORE     Kouffman Reflux v Neurogenic Cough Differentiator Neurogenic   Do you more-or-less cough all day long? yes   Does change of temperature make you cough? sometimes   Does laughing or chuckling cause you to cough? no   Do fumes (perfume, automobile fumes, burned  Toast, etc.,) cause you to cough ?      no   Does speaking, singing,  or talking on the phone cause you to cough   ?               Yes    Neurogenic/Airway score    rec Augmentin 875 mg take one pill twice daily  X 10 days - take at breakfast and supper with large glass of water.  It would help reduce the usual side effects (diarrhea and yeast infections) if you ate cultured yogurt at lunch   - sinus CT needs to be done in 10 days -> still Pos R max sinusitis > rec 10 more days then re ct  Prednisone 10 mg take  4 each am x 2 days,   2 each am x 2 days,  1 each am x 2 days and stop  Nexium  40 mg   Take  30-60 min before first meal of the day and Pepcid (famotidine)  20 mg one @  bedtime until return to office - this is the best way to tell whether stomach acid is contributing to your problem.   GERD (REFLUX)    Take delsym two tsp every 12 hours and supplement if needed with  tramadol 50 mg up to 2 every 4 hours to suppress the urge to cough.  Please schedule a follow up office visit in 4 weeks, sooner if needed  with all medications /inhalers/ solutions in hand so we can verify exactly what you are taking. This includes all medications from all doctors and over the counters    08/15/2016  f/u ov/[name] re: recurrent cough/ resolved p prolongerd R for R Max sinusitis with f/u ct pending  Chief  Complaint  Patient presents with  . Follow-up    Cough has improved "for now". No new co's today.    Not limited by breathing from desired activities  / only med still taking = max gerd rx / no longer needing any cough med  No obvious day to day or daytime variability or assoc excess/ purulent sputum or mucus plugs or hemoptysis or cp or chest tightness, subjective wheeze or overt sinus or hb symptoms. No unusual exp hx or h/o childhood pna/ asthma or knowledge of premature birth.  Sleeping ok without nocturnal  or early am exacerbation  of respiratory  c/o's or need for noct saba. Also denies any obvious fluctuation of symptoms with weather or environmental changes or other aggravating or alleviating factors except as outlined above   Current Medications, Allergies, Complete Past Medical History, Past Surgical History, Family History, and Social History were reviewed in [employer] record.  ROS  The following are not active complaints unless bolded sore throat, dysphagia, dental problems, itching, sneezing,  nasal congestion or excess/ purulent secretions, ear ache,   fever, chills, sweats, unintended wt loss, classically pleuritic or exertional cp,  orthopnea pnd or leg swelling, presyncope, palpitations, abdominal pain, anorexia, nausea, vomiting, diarrhea  or change in bowel or bladder habits, change in stools or urine, dysuria,hematuria,  rash, arthralgias, visual complaints, headache, numbness, weakness or ataxia or problems with walking or coordination,  change in mood/affect or memory.               Objective:   Physical Exam    amb bm nad    08/16/2016      207   07/15/16 209 lb 12.8 oz (95.2 kg)  02/26/16 214 lb 9.6 oz (97.3 kg)  05/17/14 215 lb 3.2 oz (97.6 kg)    Vital signs reviewed - -  Note on arrival 02 sats  98% on RA   HEENT: nl dentition, turbinates bilaterally, and oropharynx. Nl external ear canals without cough reflex   NECK :  without  JVD/Nodes/TM/ nl carotid upstrokes bilaterally   LUNGS: no acc muscle use,  Nl contour chest which is clear to A and P bilaterally without cough on insp or exp maneuvers   CV:  RRR  no s3 or murmur or increase in P2, and no edema   ABD:  soft and nontender with nl inspiratory excursion in the supine position. No bruits or organomegaly appreciated, bowel sounds nl  MS:  Nl gait/ ext warm without deformities, calf tenderness, cyanosis or clubbing No obvious joint restrictions   SKIN: warm and dry without lesions    NEURO:  alert, approp, nl sensorium with  no motor or cerebellar deficits apparent.           Assessment & Plan:

## 2016-08-15 NOTE — Patient Instructions (Addendum)
Definitely get sinus CT as scheduled and we will refer you to ENT if needed   Ok to drop the nexium back to takeas needed as per Lyndel Safe   At onset of a cough for any reason >  nexium 40 mg Take 30-60 min before first meal of the day and Pepcid 20 mg at bedtime (over the counter) and use delsym to control it   GERD (REFLUX)  is an extremely common cause of respiratory symptoms just like yours , many times with no obvious heartburn at all.    It can be treated with medication, but also with lifestyle changes including elevation of the head of your bed (ideally with 6 inch  bed blocks),  Smoking cessation, avoidance of late meals, excessive alcohol, and avoid fatty foods, chocolate, peppermint, colas, red wine, and acidic juices such as orange juice.  NO MINT OR MENTHOL PRODUCTS SO NO COUGH DROPS  USE SUGARLESS CANDY INSTEAD (Jolley ranchers or Stover's or Life Savers) or even ice chips will also do - the key is to swallow to prevent all throat clearing. NO OIL BASED VITAMINS - use powdered substitutes.  Pulmonary follow up is as needed

## 2016-08-16 ENCOUNTER — Encounter: Payer: Self-pay | Admitting: Internal Medicine

## 2016-08-16 NOTE — Assessment & Plan Note (Signed)
02/14/2014 Spiro: normal 02/26/2016 Ige 85  RAST pos dust only and Eos 0.0  - FENO 02/26/16  14  Off all rx during flare of cough  - 2/62/0355 cyclical cough regimen  - Sinus CT 07/22/2016 >>>  1. Right maxillary sinus air-fluid level as can be seen with sinusitis. > rec augmentin x 10 more days then repeat    Clearly this cough is related to sinusitis with pnds though as in many recurrent coughers there may be more than one cause.  Of the three most common causes of  Sub-acute or recurrent or chronic cough, only one (GERD)  can actually contribute to/ trigger  the other two (asthma and post nasal drip syndrome)  and perpetuate the cylce of cough.  While not intuitively obvious, many patients with chronic low grade reflux do not cough until there is a primary insult that disturbs the protective epithelial barrier and exposes sensitive nerve endings.   This is typically viral/ "head cold" / sinusitis  but can be direct physical injury such as with an endotracheal tube.   The point is that once this occurs, it is difficult to eliminate the cycle  using anything but a maximally effective acid suppression regimen at least in the short run, accompanied by an appropriate diet to address non acid GERD and control / eliminate the cough itself for at least 3 days.   For now therefore ok to drop back to prev doses of Nexium per GI but at the onset of cough for any reason advised pt to push max levels of acid suppression and see if the next episode is milder  In the meantime need to assure that his sinus dz is adequately resolved > repeat ct sinus next   I had an extended discussion with the patient reviewing all relevant studies completed to date and  lasting 15 to 20 minutes of a 25 minute visit    Each maintenance medication was reviewed in detail including most importantly the difference between maintenance and prns and under what circumstances the prns are to be triggered using an action plan format  that is not reflected in the computer generated alphabetically organized AVS.    Please see AVS for specific instructions unique to this visit that I personally wrote and verbalized to the the pt in detail and then reviewed with pt  by my nurse highlighting any  changes in therapy recommended at today's visit to their plan of care.

## 2016-08-19 ENCOUNTER — Inpatient Hospital Stay: Admission: RE | Admit: 2016-08-19 | Payer: BLUE CROSS/BLUE SHIELD | Source: Ambulatory Visit

## 2016-08-21 ENCOUNTER — Inpatient Hospital Stay: Admission: RE | Admit: 2016-08-21 | Payer: BLUE CROSS/BLUE SHIELD | Source: Ambulatory Visit

## 2017-01-30 ENCOUNTER — Ambulatory Visit: Payer: BLUE CROSS/BLUE SHIELD | Admitting: Sports Medicine

## 2017-10-30 IMAGING — CT CT PARANASAL SINUSES LIMITED
1 of 2 series · 8 of 11 positions shown, 10 images · non-contrast
Comparison: None.

CLINICAL DATA: Chronic cough for 6 years.  Recent productive cough.

EXAM:
CT PARANASAL SINUS LIMITED WITHOUT CONTRAST
TECHNIQUE: Non-contiguous multidetector CT images of the paranasal sinuses were
obtained in a single plane without contrast.

[Series 4: limited sinus st · axial · 0.24mm/px · z∈[+139,+209]mm · 8 of 10 slices shown, 10 images]
[im 2/10  brain]
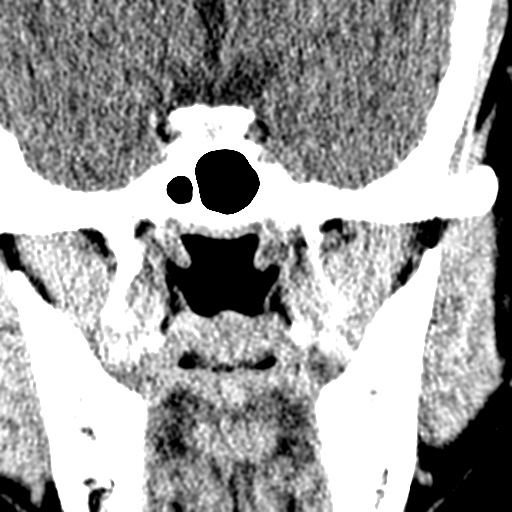
[im 2/10  bone]
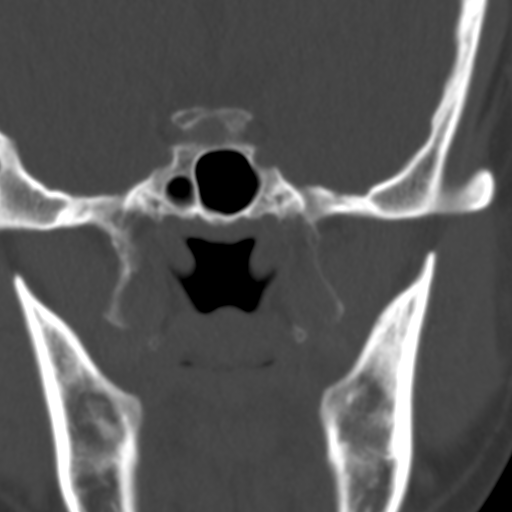
[im 3/10  bone]
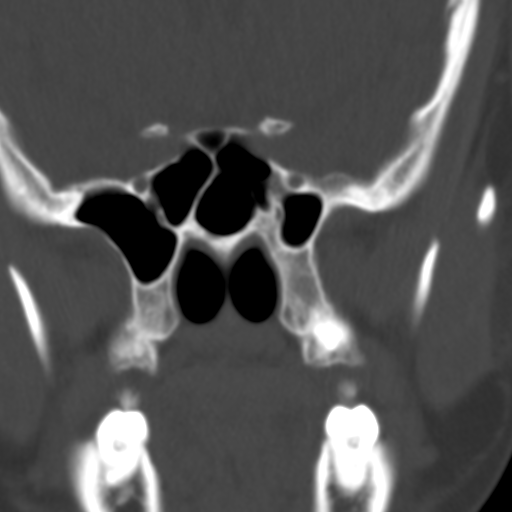
[im 4/10  bone]
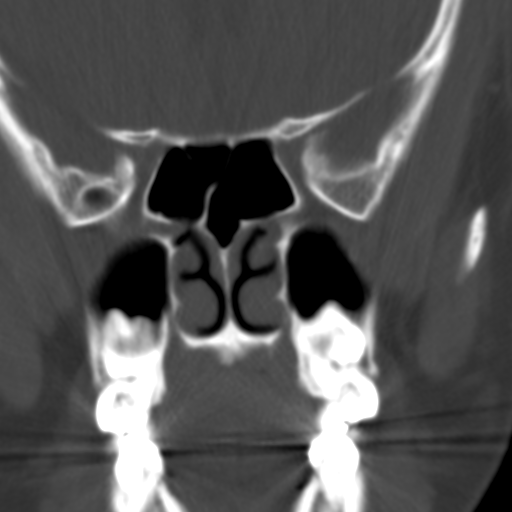
[im 5/10  bone]
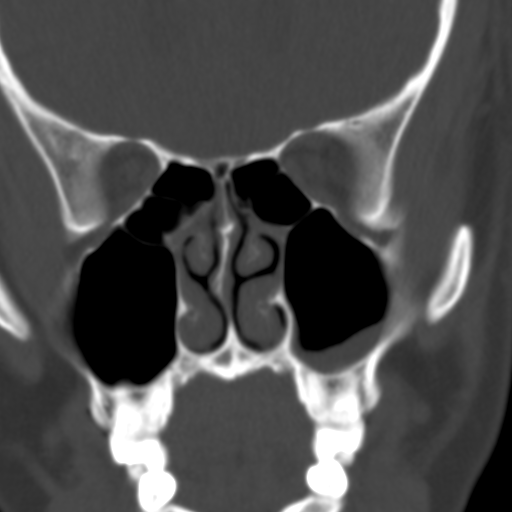
[im 6/10  brain]
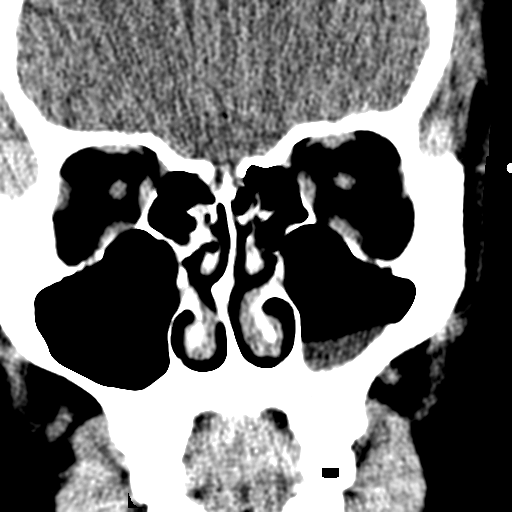
[im 6/10  bone]
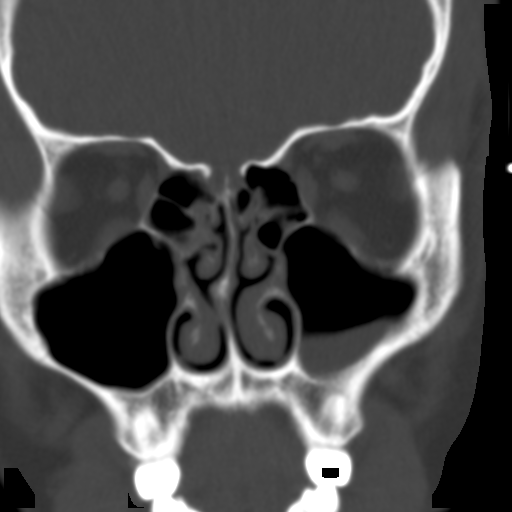
[im 7/10  bone]
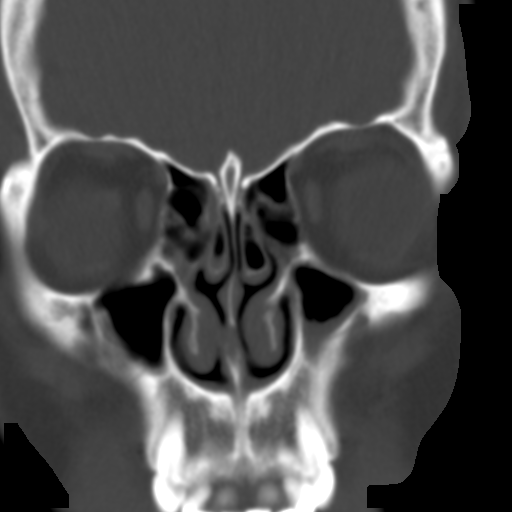
[im 8/10  bone]
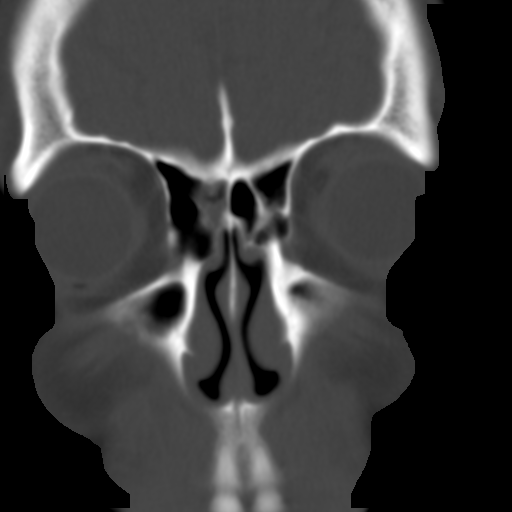
[im 9/10  bone]
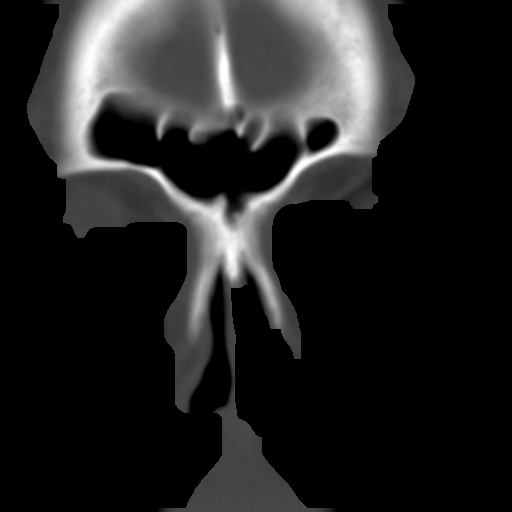

[8 of 11 positions shown; findings below may reference images not displayed]

FINDINGS: Small right maxillary sinus air-fluid level. The remainder of the
paranasal sinuses including the sphenoid, ethmoid and frontal
sinuses are identified without evidence of disease. No other
air-fluid levels. Mild leftward septal deviation. The ostiomeatal
complexes are patent bilaterally. The frontoethmoidal recesses are
normal bilaterally. No bony abnormalities are identified.
IMPRESSION: 1. Right maxillary sinus air-fluid level as can be seen with
sinusitis.

## 2018-01-04 DIAGNOSIS — R0789 Other chest pain: Secondary | ICD-10-CM

## 2018-02-04 ENCOUNTER — Encounter: Payer: Self-pay | Admitting: Gastroenterology

## 2018-02-10 ENCOUNTER — Encounter: Payer: Self-pay | Admitting: Gastroenterology

## 2018-02-16 ENCOUNTER — Ambulatory Visit: Payer: BLUE CROSS/BLUE SHIELD | Admitting: Gastroenterology

## 2018-02-16 ENCOUNTER — Other Ambulatory Visit: Payer: Self-pay | Admitting: Gastroenterology

## 2018-02-16 ENCOUNTER — Encounter: Payer: Self-pay | Admitting: Gastroenterology

## 2018-02-16 VITALS — BP 124/72 | HR 88 | Ht 69.0 in | Wt 200.4 lb

## 2018-02-16 DIAGNOSIS — Z800 Family history of malignant neoplasm of digestive organs: Secondary | ICD-10-CM

## 2018-02-16 DIAGNOSIS — R110 Nausea: Secondary | ICD-10-CM | POA: Diagnosis not present

## 2018-02-16 DIAGNOSIS — K219 Gastro-esophageal reflux disease without esophagitis: Secondary | ICD-10-CM | POA: Diagnosis not present

## 2018-02-16 MED ORDER — SOD PICOSULFATE-MAG OX-CIT ACD 10-3.5-12 MG-GM -GM/160ML PO SOLN: 1.0000 | Freq: Once | ORAL | 0 refills | Status: AC

## 2018-02-16 MED ORDER — ONDANSETRON 4 MG PO TBDP: 4.0000 mg | ORAL_TABLET | Freq: Three times a day (TID) | ORAL | 2 refills | Status: DC | PRN

## 2018-02-16 MED ORDER — ESOMEPRAZOLE MAGNESIUM 40 MG PO CPDR: 40.0000 mg | DELAYED_RELEASE_CAPSULE | Freq: Every day | ORAL | 6 refills | Status: DC

## 2018-02-16 NOTE — Patient Instructions (Signed)
If you are age 43 or older, your body mass index should be between 23-30. Your Body mass index is 29.59 kg/m. If this is out of the aforementioned range listed, please consider follow up with your Primary Care Provider.  If you are age 52 or younger, your body mass index should be between 19-25. Your Body mass index is 29.59 kg/m. If this is out of the aformentioned range listed, please consider follow up with your Primary Care Provider.   We have sent the following medications to your pharmacy for you to pick up at your convenience: Nexium 40 mg  Zofran 4 mg  Clenpiq  You have been scheduled for an endoscopy and colonoscopy. Please follow the written instructions given to you at your visit today. Please pick up your prep supplies at the pharmacy within the next 1-3 days. If you use inhalers (even only as needed), please bring them with you on the day of your procedure. Your physician has requested that you go to www.startemmi.com and enter the access code given to you at your visit today. This web site gives a general overview about your procedure. However, you should still follow specific instructions given to you by our office regarding your preparation for the procedure.   Thank you,  Dr. Jackquline Denmark

## 2018-02-16 NOTE — Progress Notes (Signed)
Chief Complaint: nausea  Referring Provider:  Cher Nakai, MD      ASSESSMENT AND PLAN;   #1. Nausea with H/O GERD. Nl CBC, CMP 11/2017, neg stress test  12/2017. EGD 07/2014: Healed distal esophageal erosions. Neg CT 03/2015, [place] 02/2015.  #2. FH colon cancer (dad at age 43). Neg colon 03/2015.  Plan: - Resume Nexium 40 mg p.o. once a day - Zofran 4 mg ODT 1 every 6-8 hours as needed for nausea. - Proceed with EGD and colon.  I have discussed risks and benefits in detail.  Patient wishes to proceed.  Consent forms were given. - Patient is to call us if he still has problems.   HPI:    Brad Barnett is a 42 y.o. male  Seen at request of Dr. Truman Hayward History of nausea x 1 month. Could not identify any exacerbating factors. occ hearburn No abdo pain No diarrhea or contipation No recent weight loss or loss of appetite. Denies any nonsteroidal use. No odynophagia or dysphagia.  He has been evaluated at [facility] for noncardiac chest pains and nausea.  MI was ruled out.  His labs were all normal.  He underwent outpatient stress test at [facility] which was negative for any cardiac etiology.  He was advised GI visit.  He had stopped taking Nexium on his own since he felt better.  Past Medical History:  Diagnosis Date  . Allergic rhinitis   . Anemia   . Chronic headaches   . Cough   . Dyspnea   . ED (erectile dysfunction)   . GERD (gastroesophageal reflux disease)   . Headache   . High risk medication use   . Hypogonadism male   . Metabolic syndrome     Past Surgical History:  Procedure Laterality Date  . COLONOSCOPY  04/04/2015   Small internal hemorrhoids. Otherwise normal colonoscopy.   . WISDOM TOOTH EXTRACTION     all 4 around 2010    Family History  Problem Relation Age of Onset  . Heart disease Father        Congestive Heart Failure  . Prostate cancer Father   . Colon cancer Father   . Colon polyps Father   . Breast cancer Paternal Aunt   .  Hypertension Mother   . Colon polyps Mother   . Diabetes Paternal Grandmother   . Esophageal cancer Neg Hx     Social History   Tobacco Use  . Smoking status: Never Smoker  . Smokeless tobacco: Never Used  Substance Use Topics  . Alcohol use: Yes    Frequency: Never    Comment: ocassionally  . Drug use: No    No current outpatient medications on file.   No current facility-administered medications for this visit.     No Known Allergies  Review of Systems:  Constitutional: Denies fever, chills, diaphoresis, appetite change and fatigue.  HEENT: Denies photophobia, eye pain, redness, hearing loss, ear pain, congestion, sore throat, rhinorrhea, sneezing, mouth sores, neck pain, neck stiffness and tinnitus.   Respiratory: Denies SOB, DOE, cough, chest tightness,  and wheezing.   Cardiovascular: Denies chest pain, palpitations and leg swelling.  Genitourinary: Denies dysuria, urgency, frequency, hematuria, flank pain and difficulty urinating.  Musculoskeletal: Denies myalgias, back pain, joint swelling, arthralgias and gait problem.  Skin: No rash.  Neurological: Denies dizziness, seizures, syncope, weakness, light-headedness, numbness and headaches.  Hematological: Denies adenopathy. Easy bruising, personal or family bleeding history  Psychiatric/Behavioral: No anxiety or depression  Physical Exam:    BP 124/72   Pulse 88   Ht 5\' 9"  (1.753 m)   Wt 200 lb 6 oz (90.9 kg)   BMI 29.59 kg/m  Filed Weights   02/16/18 1348  Weight: 200 lb 6 oz (90.9 kg)   Constitutional:  Well-developed, in no acute distress. Psychiatric: Normal mood and affect. Behavior is normal. HEENT: Pupils normal.  Conjunctivae are normal. No scleral icterus. Neck supple.  Cardiovascular: Normal rate, regular rhythm. No edema Pulmonary/chest: Effort normal and breath sounds normal. No wheezing, rales or rhonchi. Abdominal: Soft, nondistended. Nontender. Bowel sounds active throughout. There are  no masses palpable. No hepatomegaly. Rectal:  defered Neurological: Alert and oriented to person place and time. Skin: Skin is warm and dry. No rashes noted.   Carmell Austria, MD 02/16/2018, 1:59 PM  Cc: Cher Nakai, MD

## 2018-02-19 ENCOUNTER — Encounter: Payer: Self-pay | Admitting: Gastroenterology

## 2018-02-24 ENCOUNTER — Telehealth: Payer: Self-pay | Admitting: Gastroenterology

## 2018-02-24 NOTE — Telephone Encounter (Signed)
I have called patient and left message for him to call back, I have switched prep to Miralax and mailed him knew instructions.

## 2018-03-05 ENCOUNTER — Ambulatory Visit (AMBULATORY_SURGERY_CENTER): Payer: BLUE CROSS/BLUE SHIELD | Admitting: Gastroenterology

## 2018-03-05 ENCOUNTER — Encounter: Payer: Self-pay | Admitting: Gastroenterology

## 2018-03-05 VITALS — BP 127/78 | HR 68 | Temp 97.8°F | Resp 19 | Ht 69.0 in | Wt 200.0 lb

## 2018-03-05 DIAGNOSIS — K635 Polyp of colon: Secondary | ICD-10-CM

## 2018-03-05 DIAGNOSIS — K219 Gastro-esophageal reflux disease without esophagitis: Secondary | ICD-10-CM

## 2018-03-05 DIAGNOSIS — D125 Benign neoplasm of sigmoid colon: Secondary | ICD-10-CM

## 2018-03-05 DIAGNOSIS — R12 Heartburn: Secondary | ICD-10-CM

## 2018-03-05 DIAGNOSIS — Z800 Family history of malignant neoplasm of digestive organs: Secondary | ICD-10-CM | POA: Diagnosis not present

## 2018-03-05 DIAGNOSIS — Z1211 Encounter for screening for malignant neoplasm of colon: Secondary | ICD-10-CM

## 2018-03-05 DIAGNOSIS — R110 Nausea: Secondary | ICD-10-CM

## 2018-03-05 MED ORDER — SODIUM CHLORIDE 0.9 % IV SOLN: 500.0000 mL | Freq: Once | INTRAVENOUS | Status: DC

## 2018-03-05 NOTE — Patient Instructions (Signed)
Handouts given on polyps and hemorrhoids. Decrease Nexium to every other day.   YOU HAD AN ENDOSCOPIC PROCEDURE TODAY AT [facility]:   Refer to the procedure report that was given to you for any specific questions about what was found during the examination.  If the procedure report does not answer your questions, please call your gastroenterologist to clarify.  If you requested that your care partner not be given the details of your procedure findings, then the procedure report has been included in a sealed envelope for you to review at your convenience later.  YOU SHOULD EXPECT: Some feelings of bloating in the abdomen. Passage of more gas than usual.  Walking can help get rid of the air that was put into your GI tract during the procedure and reduce the bloating. If you had a lower endoscopy (such as a colonoscopy or flexible sigmoidoscopy) you may notice spotting of blood in your stool or on the toilet paper. If you underwent a bowel prep for your procedure, you may not have a normal bowel movement for a few days.  Please Note:  You might notice some irritation and congestion in your nose or some drainage.  This is from the oxygen used during your procedure.  There is no need for concern and it should clear up in a day or so.  SYMPTOMS TO REPORT IMMEDIATELY:   Following lower endoscopy (colonoscopy or flexible sigmoidoscopy):  Excessive amounts of blood in the stool  Significant tenderness or worsening of abdominal pains  Swelling of the abdomen that is new, acute  Fever of 100F or higher   Following upper endoscopy (EGD)  Vomiting of blood or coffee ground material  New chest pain or pain under the shoulder blades  Painful or persistently difficult swallowing  New shortness of breath  Fever of 100F or higher  Black, tarry-looking stools  For urgent or emergent issues, a gastroenterologist can be reached at any hour by calling [phone].   DIET:  We do  recommend a small meal at first, but then you may proceed to your regular diet.  Drink plenty of fluids but you should avoid alcoholic beverages for 24 hours.  ACTIVITY:  You should plan to take it easy for the rest of today and you should NOT DRIVE or use heavy machinery until tomorrow (because of the sedation medicines used during the test).    FOLLOW UP: Our staff will call the number listed on your records the next business day following your procedure to check on you and address any questions or concerns that you may have regarding the information given to you following your procedure. If we do not reach you, we will leave a message.  However, if you are feeling well and you are not experiencing any problems, there is no need to return our call.  We will assume that you have returned to your regular daily activities without incident.  If any biopsies were taken you will be contacted by phone or by letter within the next 1-3 weeks.  Please call us at [phone] if you have not heard about the biopsies in 3 weeks.    SIGNATURES/CONFIDENTIALITY: You and/or your care partner have signed paperwork which will be entered into your electronic medical record.  These signatures attest to the fact that that the information above on your After Visit Summary has been reviewed and is understood.  Full responsibility of the confidentiality of this discharge information lies with you and/or  your care-partner. 

## 2018-03-05 NOTE — Progress Notes (Signed)
Called to room to assist during endoscopic procedure.  Patient ID and intended procedure confirmed with present staff. Received instructions for my participation in the procedure from the performing physician.  

## 2018-03-05 NOTE — Op Note (Signed)
[place] Patient Name: Brad Barnett Procedure Date: 03/05/2018 1:41 PM MRN: [id number] Endoscopist: Jackquline Denmark , MD Age: 42 Referring MD:  Date of Birth: 1975-11-30 Gender: Male Account #: [id number] Procedure:                Colonoscopy Indications:              Family history of colon cancer-dad at the age of 85. Medicines:                Monitored Anesthesia Care Procedure:                Pre-Anesthesia Assessment:                           - Prior to the procedure, a History and Physical                            was performed, and patient medications and                            allergies were reviewed. The patient's tolerance of                            previous anesthesia was also reviewed. The risks                            and benefits of the procedure and the sedation                            options and risks were discussed with the patient.                            All questions were answered, and informed consent                            was obtained. Prior Anticoagulants: The patient has                            taken no previous anticoagulant or antiplatelet                            agents. ASA Grade Assessment: I - A normal, healthy                            patient. After reviewing the risks and benefits,                            the patient was deemed in satisfactory condition to                            undergo the procedure.                           After obtaining informed consent, the colonoscope  was passed under direct vision. Throughout the                            procedure, the patient's blood pressure, pulse, and                            oxygen saturations were monitored continuously. The                            Colonoscope was introduced through the anus and                            advanced to the 2 cm into the ileum. The                            colonoscopy was performed  without difficulty. The                            patient tolerated the procedure well. The quality                            of the bowel preparation was excellent. Scope In: 1:53:04 PM Scope Out: 2:05:14 PM Scope Withdrawal Time: 0 hours 10 minutes 7 seconds  Total Procedure Duration: 0 hours 12 minutes 10 seconds  Findings:                 A 4 mm polyp was found in the mid sigmoid colon.                            The polyp was semi-pedunculated. The polyp was                            removed with a cold snare. Resection and retrieval                            were complete.                           Non-bleeding internal hemorrhoids were found. The                            hemorrhoids were small.                           The exam was otherwise without abnormality on                            direct and retroflexion views. Complications:            No immediate complications. Estimated Blood Loss:     Estimated blood loss: none. Impression:               -Small colonic polyp status post polypectomy.                           -Small internal  hemorrhoids                           -Otherwise normal colonoscopy to TI. Recommendation:           - Patient has a contact number available for                            emergencies. The signs and symptoms of potential                            delayed complications were discussed with the                            patient. Return to normal activities tomorrow.                            Written discharge instructions were provided to the                            patient.                           - Resume previous diet.                           - Continue present medications.                           - Await pathology results.                           - Repeat colonoscopy for surveillance based on                            pathology results.                           - Return to GI clinic PRN. Jackquline Denmark, MD 03/05/2018  2:14:01 PM This report has been signed electronically.

## 2018-03-05 NOTE — Progress Notes (Signed)
PT taken to PACU. Monitors in place. VSS. Report given to RN. 

## 2018-03-05 NOTE — Op Note (Signed)
[place] Patient Name: Brad Barnett Procedure Date: 03/05/2018 1:41 PM MRN: [id number] Endoscopist: Jackquline Denmark , MD Age: 42 Referring MD:  Date of Birth: January 07, 1976 Gender: Male Account #: [id number] Procedure:                Upper GI endoscopy Indications:              Heartburn Procedure:                Pre-Anesthesia Assessment:                           - Prior to the procedure, a History and Physical                            was performed, and patient medications and                            allergies were reviewed. The patient is competent.                            The risks and benefits of the procedure and the                            sedation options and risks were discussed with the                            patient. All questions were answered and informed                            consent was obtained. Patient identification and                            proposed procedure were verified by the physician.                            Mental Status Examination: alert and oriented.                            Airway Examination: normal oropharyngeal airway and                            neck mobility. Respiratory Examination: clear to                            auscultation. CV Examination: normal. Prophylactic                            Antibiotics: The patient does not require                            prophylactic antibiotics. Prior Anticoagulants: The                            patient has taken no previous anticoagulant or  antiplatelet agents. ASA Grade Assessment: I - A                            normal, healthy patient. After reviewing the risks                            and benefits, the patient was deemed in                            satisfactory condition to undergo the procedure.                            The anesthesia plan was to use moderate sedation /                            analgesia (conscious  sedation). Immediately prior                            to administration of medications, the patient was                            re-assessed for adequacy to receive sedatives. The                            heart rate, respiratory rate, oxygen saturations,                            blood pressure, adequacy of pulmonary ventilation,                            and response to care were monitored throughout the                            procedure. The physical status of the patient was                            re-assessed after the procedure.                           After obtaining informed consent, the endoscope was                            passed under direct vision. Throughout the                            procedure, the patient's blood pressure, pulse, and                            oxygen saturations were monitored continuously. The                            Endoscope was introduced through the mouth, and  advanced to the second part of duodenum. The upper                            GI endoscopy was accomplished without difficulty.                            The patient tolerated the procedure well. Scope In: Scope Out: Findings:                 The esophagus was normal. Healed distal esophageal                            erosions.                           The stomach was normal.                           The examined duodenum was normal. Complications:            No immediate complications. Impression:               -Normal EGD Recommendation:           -Can decrease Nexium to 40 mg every other day.                           -Nonpharmacologic means of reflux control.                           -Resume previous diet. Jackquline Denmark, MD 03/05/2018 2:10:34 PM This report has been signed electronically.

## 2018-03-05 NOTE — Progress Notes (Signed)
Denies allergies to egg or soy

## 2018-03-08 ENCOUNTER — Telehealth: Payer: Self-pay | Admitting: *Deleted

## 2018-03-08 NOTE — Telephone Encounter (Signed)
Message left

## 2018-03-08 NOTE — Telephone Encounter (Signed)
Left message on f/u call 

## 2018-03-13 ENCOUNTER — Encounter: Payer: Self-pay | Admitting: Gastroenterology

## 2018-03-19 ENCOUNTER — Telehealth: Payer: Self-pay | Admitting: Gastroenterology

## 2018-03-19 NOTE — Telephone Encounter (Signed)
Patient wants ECL results

## 2018-03-19 NOTE — Telephone Encounter (Signed)
Called and spoke with patient per patient request-patient informed of endoscopy and colonoscopy results per MD letter; patient verbalized understanding of information; patient was informed to call back if questions/concerns arise;

## 2018-11-09 ENCOUNTER — Telehealth: Payer: Self-pay | Admitting: Internal Medicine

## 2018-11-09 NOTE — Telephone Encounter (Signed)
Called and spoke with pt who stated he has been coughing x1 month now and states his cough is about the same as previous time pt last saw MW. Pt said his cough is a dry cough.  Pt denies any complaints of SOB, no wheezing. Pt states that he does have some wheezing.  Pt denies any complaints of fever, no nausea/vomiting, no diarrhea.  Pt has tried robitussin OTC which he states has not helped with his symptoms. Pt is still taking nexium when needed.  Stated to pt since it has been two years since we last saw him that he would need to schedule an OV prior to receiving meds and pt verbalized understanding. Pt's COVID screen was negative so pt has been scheduled an in office visit with MW Fri. 7/17 at 4pm. Pt was given new office address. Nothing further needed.

## 2018-11-12 ENCOUNTER — Other Ambulatory Visit: Payer: Self-pay

## 2018-11-12 ENCOUNTER — Encounter: Payer: Self-pay | Admitting: Internal Medicine

## 2018-11-12 ENCOUNTER — Ambulatory Visit: Payer: BC Managed Care – PPO | Admitting: Internal Medicine

## 2018-11-12 DIAGNOSIS — K219 Gastro-esophageal reflux disease without esophagitis: Secondary | ICD-10-CM | POA: Diagnosis not present

## 2018-11-12 DIAGNOSIS — R05 Cough: Secondary | ICD-10-CM

## 2018-11-12 DIAGNOSIS — R059 Cough, unspecified: Secondary | ICD-10-CM

## 2018-11-12 NOTE — Patient Instructions (Addendum)
We will contact you with sinus CT scheduling and results  Nexium should be Take 30- 60 min before your first and last meals of the day   GERD (REFLUX)  is an extremely common cause of respiratory symptoms just like yours , many times with no obvious heartburn at all.    It can be treated with medication, but also with lifestyle changes including elevation of the head of your bed (ideally with 6 -8inch blocks under the headboard of your bed),  Smoking cessation, avoidance of late meals, excessive alcohol, and avoid fatty foods, chocolate, peppermint, colas, red wine, and acidic juices such as orange juice.  NO MINT OR MENTHOL PRODUCTS SO NO COUGH DROPS  USE SUGARLESS CANDY INSTEAD (Jolley ranchers or Stover's or Life Savers) or even ice chips will also do - the key is to swallow to prevent all throat clearing. NO OIL BASED VITAMINS - use powdered substitutes.  Avoid fish oil when coughing.

## 2018-11-12 NOTE — Progress Notes (Signed)
Subjective:   Patient ID: Brad Barnett, male    DOB: [birth year]    MRN: [id number]    Brief patient profile:  89  yobm  never smoker with chronic cough onset mid 30's prev dx as asthma but minimally responsive to pred/ ICS    History of Present Illness  02/26/2016 NP  Acute OV  Patient presents for an acute office visit. Patient complains over the last 3-4 weeks that he's had increased cough that is mainly dry. His last seen in the office in greater than 1-1/2 years ago. Previously treated with Asmanex and Nexium /Flonase.  Says cough got better but never went away totally . Has been off asmanex x 3 months  Not using any cough meds  FeNO 14 .  Previous spirometry normal in 2015 rec Restart Asmanex daily rinse well after use.  Begin Zyrtec 10mg  At bedtime  .  Begin Delsym 2 tsp Twice daily  As needed  Cough.  Begin Pepcid 20mg  At bedtime  .  Check chest xray and labs today  Follow up Dr. [name]  In 6 weeks with PFT and As needed     07/15/2016  Acute extended  ov/Emalea Mix re:  Recurrent cough  X 2010 with months in between of no cough in between  Chief Complaint  Patient presents with   Acute Visit    Pt c/o increased cough for the past month. Cough is prod with green sputum. He states he had hemoptysis x 3 wks ago and PCP put him on Doxy and he has 1 dose left. He states also having SOB since he has been coughing and has had HA's.   cough coming and going x  2010 can go away for months at a time regardless of rx this episode started Sept 2017 Cough is usually dry but turned productive x 3 weeks and even p doxy still green   Mostly just sob when coughing  In retrospect he insists that nothing he has ever taken ameliorates the cough, it just goes away p several months on its own   Kouffman Reflux v Neurogenic Cough Differentiator Reflux Comments  Do you awaken from a sound sleep coughing violently?                            With trouble breathing? Much worse   Do you have  choking episodes when you cannot  Get enough air, gasping for air ?              Yes   Do you usually cough when you lie down into  The bed, or when you just lie down to rest ?                          Yes every night   Do you usually cough after meals or eating?         No    Do you cough when (or after) you bend over?    No    GERD SCORE     Kouffman Reflux v Neurogenic Cough Differentiator Neurogenic   Do you more-or-less cough all day long? yes   Does change of temperature make you cough? sometimes   Does laughing or chuckling cause you to cough? no   Do fumes (perfume, automobile fumes, burned  Toast, etc.,) cause you to cough ?      no   Does  speaking, singing, or talking on the phone cause you to cough   ?               Yes    Neurogenic/Airway score    rec Augmentin 875 mg take one pill twice daily  X 10 days - take at breakfast and supper with large glass of water.  It would help reduce the usual side effects (diarrhea and yeast infections) if you ate cultured yogurt at lunch   - sinus CT needs to be done in 10 days -> still Pos R max sinusitis > rec 10 more days then re ct  Prednisone 10 mg take  4 each am x 2 days,   2 each am x 2 days,  1 each am x 2 days and stop  Nexium  40 mg   Take  30-60 min before first meal of the day and Pepcid (famotidine)  20 mg one @  bedtime until return to office - this is the best way to tell whether stomach acid is contributing to your problem.   GERD (REFLUX)    Take delsym two tsp every 12 hours and supplement if needed with  tramadol 50 mg up to 2 every 4 hours to suppress the urge to cough.  Please schedule a follow up office visit in 4 weeks, sooner if needed  with all medications /inhalers/ solutions in hand so we can verify exactly what you are taking. This includes all medications from all doctors and over the counters    08/15/2016  f/u ov/Saddie Sandeen re: recurrent cough/ resolved p prolongerd R for R Max sinusitis with f/u ct pending  Chief  Complaint  Patient presents with   Follow-up    Cough has improved "for now". No new co's today.  rec Definitely get sinus CT as scheduled and we will refer you to ENT if needed  Ok to drop the nexium back to takeas needed as per Lyndel Safe  At onset of a cough for any reason >  nexium 40 mg Take 30-60 min before first meal of the day and Pepcid 20 mg at bedtime (over the counter) and use delsym to control it  GERD diet   11/12/2018  f/u ov/Doratha Mcswain re: waxing and waning cough x mid 25s /dx sinusitis   /same dog  in bedroom correlates wit duration of cough though allergy profile neg for dog  Chief Complaint  Patient presents with   Follow-up    Cough is unchanged. No new co's.   Dyspnea:  3 x week does 25 min avg 4lpm and 2-3 per cent grade building up endurance Cough: random but worse p supper on ppi with meals bid > non prod/ min slt grey mucus prod Sleeping: better now that  hob up 30 degrees  SABA use: none 02: none    No obvious day to day or daytime variability or assoc  mucus plugs or hemoptysis or cp or chest tightness, subjective wheeze or overt sinus or hb symptoms.   Sleeping as above  without nocturnal  or early am exacerbation  of respiratory  c/o's or need for noct saba. Also denies any obvious fluctuation of symptoms with weather or environmental changes or other aggravating or alleviating factors except as outlined above   No unusual exposure hx or h/o childhood pna/ asthma or knowledge of premature birth.  Current Allergies, Complete Past Medical History, Past Surgical History, Family History, and Social History were reviewed in [employer] record.  ROS  The following are not active complaints unless bolded Hoarseness, sore throat, dysphagia, dental problems, itching, sneezing,  nasal congestion or discharge of excess mucus or purulent secretions, ear ache,   fever, chills, sweats, unintended wt loss or wt gain, classically pleuritic or exertional cp,   orthopnea pnd or arm/hand swelling  or leg swelling, presyncope, palpitations, abdominal pain, anorexia, nausea, vomiting, diarrhea  or change in bowel habits or change in bladder habits, change in stools or change in urine, dysuria, hematuria,  rash, arthralgias, visual complaints, headache, numbness, weakness or ataxia or problems with walking or coordination,  change in mood or  memory.        Current Meds  Medication Sig   esomeprazole (NEXIUM) 40 MG capsule Take 1 capsule (40 mg total) by mouth daily at 12 noon. (Patient taking differently: Take 40 mg by mouth daily as needed. )   ondansetron (ZOFRAN ODT) 4 MG disintegrating tablet Take 1-2 tablets (4-8 mg total) by mouth every 8 (eight) hours as needed for nausea or vomiting.                  Objective:   Physical Exam    amb bm nad    11/12/2018       216  08/16/2016      207   07/15/16 209 lb 12.8 oz (95.2 kg)  02/26/16 214 lb 9.6 oz (97.3 kg)  05/17/14 215 lb 3.2 oz (97.6 kg)    Vital signs reviewed - Note on arrival 02 sats  97% on RA     HEENT: nl dentition,  and oropharynx. Nl external ear canals without cough reflex - mod bilateral non-specific turbinate edema     NECK :  without JVD/Nodes/TM/ nl carotid upstrokes bilaterally   LUNGS: no acc muscle use,  Nl contour chest which is clear to A and P bilaterally with  cough on insp   maneuvers   CV:  RRR  no s3 or murmur or increase in P2, and no edema   ABD:  soft and nontender with nl inspiratory excursion in the supine position. No bruits or organomegaly appreciated, bowel sounds nl  MS:  Nl gait/ ext warm without deformities, calf tenderness, cyanosis or clubbing No obvious joint restrictions   SKIN: warm and dry without lesions    NEURO:  alert, approp, nl sensorium with  no motor or cerebellar deficits apparent.               Assessment & Plan:

## 2018-11-13 ENCOUNTER — Encounter: Payer: Self-pay | Admitting: Internal Medicine

## 2018-11-13 NOTE — Assessment & Plan Note (Addendum)
Onset in his 30s   02/14/2014 Spiro: normal 02/26/2016 Ige 85  RAST pos dust only and Eos 0.0  - FENO 02/26/16 =   14  Off all rx during flare of cough  - 1/42/3953 cyclical cough regimen  - Sinus CT 07/22/2016 >>>  1. Right maxillary sinus air-fluid level as can be seen with sinusitis. 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   These are the same pts (now being labeled as having "irritable larynx syndrome" by some cough centers) who not infrequently have a history of having failed to tolerate ace inhibitors,  dry powder inhalers or biphosphonates or report having atypical/extraesophageal reflux symptoms that don't respond to standard doses of PPI  and are easily confused as having aecopd or asthma flares by even experienced allergists/ pulmonologists (myself included).   >>>> continue max rx for gerd plus diet and proceed with sinus ct now as a first step then ent eval if still positive

## 2018-11-13 NOTE — Assessment & Plan Note (Addendum)
EGD  03/05/18 Lyndel Safe)  >> healed esophageal erosions, rec reduce nexium to qd   Of the three most common causes of  Sub-acute / recurrent or chronic cough, only one (GERD, which we know he has)   can actually contribute to/ trigger  the other two (asthma and post nasal drip syndrome)  and perpetuate the cylce of cough.  While not intuitively obvious, many patients with chronic low grade reflux do not cough until there is a primary insult that disturbs the protective epithelial barrier and exposes sensitive nerve endings.   This is typically viral but can due to PNDS and  either may apply here.   The point is that once this occurs, it is difficult to eliminate the cycle  using anything but a maximally effective acid suppression regimen (ppi bi ac) at least in the short run, accompanied by an appropriate diet to address non acid GERD and control / eliminate the urge to cough and clear his throat with use of hard rock candy, avoiding using of narcotic cough meds in this setting.    I had an extended discussion with the patient reviewing all relevant studies completed to date and  lasting 25 minutes of a 30 minute visit  To re-establish with me p > 2 years absence.   Each maintenance medication was reviewed in detail including most importantly the difference between maintenance and prns and under what circumstances the prns are to be triggered using an action plan format that is not reflected in the computer generated alphabetically organized AVS.     Please see AVS for specific instructions unique to this visit that I personally wrote and verbalized to the the pt in detail and then reviewed with pt  by my nurse highlighting any  changes in therapy recommended at today's visit to their plan of care.

## 2018-11-19 ENCOUNTER — Telehealth: Payer: Self-pay | Admitting: Internal Medicine

## 2018-11-19 NOTE — Telephone Encounter (Signed)
Script Screening patients for COVID-19 and reviewing new operational procedures  Greeting - The reason I am calling is to share with you some new changes to our processes that are designed to help [place] keep everyone safe. Is now a good time to speak with you? Patient says "no' - ask them when you can call back and let them know it's important to do this prior to their appointment.  Patient says "yes" - [place]) the first thing I need to do is ask you some screening Questions.  1. To the best of your knowledge, have you been in close contact with any one with a confirmed diagnosis of COVID 19? o No - proceed to next question  2. Have you had any one or more of the following: fever, chills, cough, shortness of breath or any flu-like symptoms? o No - proceed to next question  3. Have you been diagnosed with or have a previous diagnosis of COVID 19? o No - proceed to next question  4. I am going to go over a few other symptoms with you. Please let me know if you are experiencing any of the following: . Ear, nose or throat discomfort . A sore throat . Headache . Muscle pain . Diarrhea . Loss of taste or smell o No - proceed to next question  Thank you for answering these questions. Please know we will ask you these questions or similar questions when you arrive for your appointment and again it's how we are keeping everyone safe. Also, to keep you safe, please use the provided hand sanitizer when you enter the building. (Insert pt name), we are asking everyone in the building to wear a mask because they help [place] prevent the spread of germs. Do you have a mask of your own, if not, we are happy to provide one for you. The last thing I want to go over with you is the no visitor guidelines. This means no one can attend the appointment with you unless you need physical assistance. I understand this may be different from your past appointments and I know this may be difficult but  please know if someone is driving you we are happy to call them for you once your appointment is over.  [INSERT [place]  (Insert pt name) I've given you a lot of information, what questions do you have about what I've talked about today or your appointment tomorrow?

## 2018-11-22 ENCOUNTER — Other Ambulatory Visit: Payer: Self-pay

## 2018-11-22 ENCOUNTER — Ambulatory Visit (INDEPENDENT_AMBULATORY_CARE_PROVIDER_SITE_OTHER)
Admission: RE | Admit: 2018-11-22 | Discharge: 2018-11-22 | Disposition: A | Discharge status: Home / Self Care | Payer: BC Managed Care – PPO | Source: Ambulatory Visit | Attending: Internal Medicine | Admitting: Internal Medicine

## 2018-11-22 DIAGNOSIS — R05 Cough: Secondary | ICD-10-CM

## 2018-11-22 DIAGNOSIS — R059 Cough, unspecified: Secondary | ICD-10-CM

## 2018-11-22 NOTE — Progress Notes (Signed)
mychart msg sent

## 2018-11-25 ENCOUNTER — Inpatient Hospital Stay: Admission: RE | Admit: 2018-11-25 | Payer: BC Managed Care – PPO | Source: Ambulatory Visit

## 2018-11-29 ENCOUNTER — Telehealth: Payer: Self-pay | Admitting: Internal Medicine

## 2018-11-29 NOTE — Telephone Encounter (Signed)
Per result info from MW in regards to CT, pt does not need to have an ENT eval but if not better needs to have an OV with all meds in hand to discuss next step.  Attempted to call pt but line went straight to VM. Left message for pt to return call.

## 2018-11-30 NOTE — Telephone Encounter (Signed)
LMTCB x2 for pt 

## 2018-12-01 NOTE — Telephone Encounter (Signed)
Spoke with pt. He is aware of MW response. Pt has been scheduled to see MW on 12/02/2018 at 1345. Nothing further was needed.

## 2018-12-02 ENCOUNTER — Ambulatory Visit: Payer: BC Managed Care – PPO | Admitting: Internal Medicine

## 2020-10-09 ENCOUNTER — Ambulatory Visit: Payer: BC Managed Care – PPO | Admitting: Sports Medicine

## 2020-10-09 ENCOUNTER — Ambulatory Visit (INDEPENDENT_AMBULATORY_CARE_PROVIDER_SITE_OTHER): Payer: BC Managed Care – PPO

## 2020-10-09 ENCOUNTER — Other Ambulatory Visit: Payer: Self-pay

## 2020-10-09 ENCOUNTER — Encounter: Payer: Self-pay | Admitting: Sports Medicine

## 2020-10-09 DIAGNOSIS — M722 Plantar fascial fibromatosis: Secondary | ICD-10-CM

## 2020-10-09 DIAGNOSIS — M216X2 Other acquired deformities of left foot: Secondary | ICD-10-CM

## 2020-10-09 DIAGNOSIS — M7742 Metatarsalgia, left foot: Secondary | ICD-10-CM

## 2020-10-09 DIAGNOSIS — M216X1 Other acquired deformities of right foot: Secondary | ICD-10-CM | POA: Diagnosis not present

## 2020-10-09 DIAGNOSIS — M7741 Metatarsalgia, right foot: Secondary | ICD-10-CM | POA: Diagnosis not present

## 2020-10-09 MED ORDER — MELOXICAM 15 MG PO TABS: 15.0000 mg | ORAL_TABLET | Freq: Every day | ORAL | 0 refills | Status: AC

## 2020-10-09 MED ORDER — TRIAMCINOLONE ACETONIDE 10 MG/ML IJ SUSP
10.0000 mg | Freq: Once | INTRAMUSCULAR | Status: AC
  Administered 2020-10-09: 10 mg

## 2020-10-09 NOTE — Progress Notes (Signed)
Subjective: Brad Barnett is a 45 y.o. male patient presents to office with complaint of moderate heel pain on the Right>left and pain to balls of both feet. Patient admits to post static dyskinesia for 8-9 months in duration. Patient has treated this problem with stretching, icing, soaking, and wearing good shoes with no complete relief, Reports that shoes help. Denies any other pedal complaints.   Patient Active Problem List   Diagnosis Date Noted   Allergic rhinitis    GERD (gastroesophageal reflux disease)    Cough    Asthma, moderate persistent     No current outpatient medications on file prior to visit.   No current facility-administered medications on file prior to visit.    No Known Allergies  Objective: Physical Exam General: The patient is alert and oriented x3 in no acute distress.  Dermatology: Skin is warm, dry and supple bilateral lower extremities. Nails 1-10 are normal. There is no erythema, edema, no eccymosis, no open lesions present. Integument is otherwise unremarkable.  Vascular: Dorsalis Pedis pulse and Posterior Tibial pulse are 2/4 bilateral. Capillary fill time is immediate to all digits.  Neurological: Grossly intact to light touch bilateral.  Musculoskeletal: Tenderness to palpation at the medial calcaneal tubercale and through the insertion of the plantar fascia on the right>left foot. No pain with compression of calcaneus bilateral. There is pain to balls of both feet. No pain with calf compression bilateral. There is decreased Ankle joint range of motion bilateral. All other joints range of motion within normal limits bilateral. Strength 5/5 in all groups bilateral. +Bunion and pes planus.   Gait: Unassisted, Antalgic avoid weight on right  Xray, Right/Left foot:  Normal osseous mineralization. Joint spaces preserved except midtarsal with breach supportive of pes planus and increased IM angle supportive of bunion. No fracture/dislocation/boney  destruction. Calcaneal spur present with mild thickening of plantar fascia. No other soft tissue abnormalities or radiopaque foreign bodies.   Assessment and Plan: Problem List Items Addressed This Visit   None Visit Diagnoses     Plantar fasciitis of left foot    -  Primary   Relevant Orders   DG Foot Complete Left   Plantar fasciitis of right foot       Relevant Orders   DG Foot Complete Right   Metatarsalgia of both feet       Acquired equinus deformity of both feet           -Complete examination performed.  -Xrays reviewed -Discussed with patient in detail the condition of plantar fasciitis with compensation metatarsalgia, how this occurs and general treatment options. Explained both conservative and surgical treatments.  -After oral consent and aseptic prep, injected a mixture containing 1 ml of 2%  plain lidocaine, 1 ml 0.5% plain marcaine, 0.5 ml of kenalog 10 and 0.5 ml of dexamethasone phosphate into Right heel. Post-injection care discussed with patient.  -Rx Meloxicam -Recommended good supportive shoes and advised use of OTC insert. Explained to patient that if these orthoses work well, we will continue with these. If these do not improve his condition and  pain, we will consider custom molded orthoses. - Explained in detail the use of the fascial braces bilateral which was dispensed at today's visit. -Explained and dispensed to patient daily stretching exercises. -Recommend patient to ice affected area 1-2x daily. -Patient to return to office in 4 weeks for follow up or sooner if problems or questions arise.  Landis Martins, DPM

## 2020-10-09 NOTE — Patient Instructions (Signed)

## 2020-11-07 ENCOUNTER — Ambulatory Visit: Payer: BC Managed Care – PPO | Admitting: Sports Medicine

## 2020-11-28 ENCOUNTER — Ambulatory Visit: Payer: BC Managed Care – PPO | Admitting: Sports Medicine

## 2020-11-28 ENCOUNTER — Encounter: Payer: Self-pay | Admitting: Sports Medicine

## 2020-11-28 ENCOUNTER — Other Ambulatory Visit: Payer: Self-pay

## 2020-11-28 DIAGNOSIS — M7741 Metatarsalgia, right foot: Secondary | ICD-10-CM

## 2020-11-28 DIAGNOSIS — M7742 Metatarsalgia, left foot: Secondary | ICD-10-CM | POA: Diagnosis not present

## 2020-11-28 DIAGNOSIS — M216X1 Other acquired deformities of right foot: Secondary | ICD-10-CM

## 2020-11-28 DIAGNOSIS — M216X2 Other acquired deformities of left foot: Secondary | ICD-10-CM

## 2020-11-28 DIAGNOSIS — M722 Plantar fascial fibromatosis: Secondary | ICD-10-CM | POA: Diagnosis not present

## 2020-11-28 NOTE — Progress Notes (Signed)
Subjective: Brad Barnett is a 45 y.o. male patient returns to office with complaint of heel pain on the Right>left and pain to balls of both feet. Patient reports that after injection helped. 2/10 pain if any. Denies any other pedal complaints.   Patient Active Problem List   Diagnosis Date Noted   Allergic rhinitis    GERD (gastroesophageal reflux disease)    Cough    Asthma, moderate persistent     Current Outpatient Medications on File Prior to Visit  Medication Sig Dispense Refill   meloxicam (MOBIC) 15 MG tablet Take 1 tablet (15 mg total) by mouth daily. 30 tablet 0   No current facility-administered medications on file prior to visit.    No Known Allergies  Objective: Physical Exam General: The patient is alert and oriented x3 in no acute distress.  Dermatology: Skin is warm, dry and supple bilateral lower extremities. Nails 1-10 are normal. There is no erythema, edema, no eccymosis, no open lesions present. Integument is otherwise unremarkable.  Vascular: Dorsalis Pedis pulse and Posterior Tibial pulse are 2/4 bilateral. Capillary fill time is immediate to all digits.  Neurological: Grossly intact to light touch bilateral.  Musculoskeletal: No significant reproducible tenderness to palpation at the medial calcaneal tubercale and through the insertion of the plantar fascia on the right or left foot. No pain with compression of calcaneus bilateral. There is no reproducible pain to balls of both feet. No pain with calf compression bilateral. There is decreased Ankle joint range of motion bilateral. All other joints range of motion within normal limits bilateral. Strength 5/5 in all groups bilateral. +Bunion and pes planus.    Assessment and Plan: Problem List Items Addressed This Visit   None Visit Diagnoses     Plantar fasciitis of right foot    -  Primary   Plantar fasciitis of left foot       Metatarsalgia of both feet       Acquired equinus deformity of both  feet           -Complete examination performed.  -Re-Discussed with patient in detail the condition of plantar fasciitis with compensation metatarsalgia, how this occurs and general treatment options. Explained both conservative and surgical treatments.  -Since his pain is doing much better we will now proceed with preventative care and advised patient to benefit of good supportive shoes and orthotics -Orthotic estimate form provided to patient for him to call his insurance to check the benefits to see if he wants to proceed with getting custom orthotics made at a later date -Rx Night splint -Recommend continue with daily stretching exercises. -Recommend patient to ice affected area 1-2x daily if needed for a flare -Patient to return to office as needed or sooner if problems or questions arise.  Landis Martins, DPM

## 2021-03-20 ENCOUNTER — Telehealth: Payer: Self-pay

## 2021-03-20 NOTE — Telephone Encounter (Signed)
I have called and spoke to patient about scheduling colonoscopy. Patient said he is waiting for his schedule at work and will call back to schedule once he gets it.

## 2021-06-11 ENCOUNTER — Encounter: Payer: Self-pay | Admitting: Gastroenterology

## 2022-12-05 ENCOUNTER — Ambulatory Visit: Payer: BC Managed Care – PPO | Admitting: Podiatry

## 2022-12-19 ENCOUNTER — Ambulatory Visit (INDEPENDENT_AMBULATORY_CARE_PROVIDER_SITE_OTHER): Payer: BC Managed Care – PPO | Admitting: Podiatry

## 2022-12-19 DIAGNOSIS — Z91199 Patient's noncompliance with other medical treatment and regimen due to unspecified reason: Secondary | ICD-10-CM

## 2022-12-19 NOTE — Progress Notes (Signed)
 NS CG

## 2023-05-25 ENCOUNTER — Telehealth: Payer: Self-pay | Admitting: Gastroenterology

## 2023-06-02 ENCOUNTER — Encounter: Payer: Self-pay | Admitting: Gastroenterology

## 2023-07-13 ENCOUNTER — Ambulatory Visit: Payer: BC Managed Care – PPO | Admitting: Gastroenterology

## 2023-07-14 ENCOUNTER — Ambulatory Visit: Admitting: Gastroenterology

## 2023-07-14 ENCOUNTER — Encounter: Payer: Self-pay | Admitting: Gastroenterology

## 2023-07-14 VITALS — BP 140/74 | HR 90 | Ht 69.0 in | Wt 241.0 lb

## 2023-07-14 DIAGNOSIS — R1310 Dysphagia, unspecified: Secondary | ICD-10-CM

## 2023-07-14 DIAGNOSIS — Z800 Family history of malignant neoplasm of digestive organs: Secondary | ICD-10-CM

## 2023-07-14 DIAGNOSIS — K219 Gastro-esophageal reflux disease without esophagitis: Secondary | ICD-10-CM

## 2023-07-14 DIAGNOSIS — Z8719 Personal history of other diseases of the digestive system: Secondary | ICD-10-CM | POA: Diagnosis not present

## 2023-07-14 MED ORDER — SUFLAVE 178.7 G PO SOLR: 1.0000 | Freq: Once | ORAL | 0 refills | Status: AC

## 2023-07-14 MED ORDER — OMEPRAZOLE 20 MG PO CPDR: 20.0000 mg | DELAYED_RELEASE_CAPSULE | Freq: Every day | ORAL | 3 refills | Status: DC

## 2023-07-14 NOTE — Patient Instructions (Addendum)
 Follow up in 4 weeks after your colonoscopy.  You have been scheduled for a colonoscopy. Please follow written instructions given to you at your visit today.   If you use inhalers (even only as needed), please bring them with you on the day of your procedure. ___________________________________________________________________________  Brad Barnett will receive your bowel preparation through [facility], which ensures the lowest copay and home delivery, with outreach via text or call from an [phone] number. Please respond promptly to avoid rescheduling of your procedure. If you are interested in alternative options or have any questions regarding your prep, please contact them at [phone] ____________________________________________________________________________  Your Provider Has Sent Your Bowel Prep Regimen To [facility]   [facility] will contact you to verify your information and collect your copay, if applicable. Enjoy the comfort of your home while your prescription is mailed to you, FREE of any shipping charges.   [facility] accepts all major insurance benefits and applies discounts & coupons.  Have additional questions?   Chat: [email or web address] Call: [phone] Email: [email or web address] NCPDP: [id number]  How will [facility] contact you?  With a Welcome phone call,  a Welcome text and a checkout link in text form.  Texts you receive from [phone] Are NOT Spam.  *To set up delivery, you must complete the checkout process via link or speak to one of the patient care representatives. If [facility] is unable to reach you, your prescription may be delayed.  To avoid long hold times on the phone, you may also utilize the secure chat feature on the [facility] website to request that they call you back for transaction completion or to expedite your concerns.  Due to recent changes in healthcare laws, you may see the results of your imaging and laboratory studies on MyChart  before your provider has had a chance to review them.  We understand that in some cases there may be results that are confusing or concerning to you. Not all laboratory results come back in the same time frame and the provider may be waiting for multiple results in order to interpret others.  Please give [place] 48 hours in order for your provider to thoroughly review all the results before contacting the office for clarification of your results.   Thank you for trusting me with your gastrointestinal care!   Boone Master, PA

## 2023-07-14 NOTE — Progress Notes (Signed)
 Chief Complaint: GERD Primary GI MD:Dr.Gupta  HPI: Discussed the use of AI scribe software for clinical note transcription with the patient, who gave verbal consent to proceed.  History of Present Illness   Brad Barnett is a 48 year old male who presents for a colonoscopy.  He is undergoing a colonoscopy as part of routine surveillance due to a family history of colon cancer. His father died of colon and prostate cancer age 66. A previous colonoscopy in 2019 revealed a small inflammatory polyp and hemorrhoids, and he was advised to return in three years due to his family history.  In January, he experienced a prolonged episode of diarrhea lasting approximately 10-12 days, which persisted despite taking Imodium, providing only temporary relief. This episode coincided with a work trip to [place] around January 18th and resolved after returning. His medical doctor suspected irritable bowel syndrome and prescribed medication, which he discontinued once symptoms improved.  He reports worsening symptoms of gastroesophageal reflux, including regurgitation and a sensation of food getting stuck, particularly when lying down. He is not currently taking any antacids and experiences these symptoms intermittently, especially at night. No current pain is reported.  He takes meloxicam as needed for arthritis-related aches and pains, approximately three to four times a week.     PREVIOUS GI WORKUP   Colonoscopy 02/2018 - Small colonic polyp status post polypectomy (inflammatory polyp).  - Small internal hemorrhoids  - Otherwise normal colonoscopy to TI. - Repeat 3 years due to strong family history  EGD 02/2018 for GERD - normal EGD  Past Medical History:  Diagnosis Date   Allergic rhinitis    Anemia    Asthma    Chronic headaches    Cough    Dyspnea    ED (erectile dysfunction)    GERD (gastroesophageal reflux disease)    Headache    High risk medication use    Hypogonadism male     Metabolic syndrome     Past Surgical History:  Procedure Laterality Date   COLONOSCOPY  04/04/2015   Small internal hemorrhoids. Otherwise normal colonoscopy.    EYE SURGERY     rightat age of 32, the back of nail went in his eye   WISDOM TOOTH EXTRACTION     all 4 around 2010    Current Outpatient Medications  Medication Sig Dispense Refill   lisinopril-hydrochlorothiazide (ZESTORETIC) 10-12.5 MG tablet Take 1 tablet by mouth daily.     omeprazole (PRILOSEC) 20 MG capsule Take 1 capsule (20 mg total) by mouth daily. 30 capsule 3   PEG 3350-KCl-NaCl-NaSulf-MgSul (SUFLAVE) 178.7 g SOLR Take 1 kit by mouth once for 1 dose. 1 each 0   meloxicam (MOBIC) 15 MG tablet Take 1 tablet (15 mg total) by mouth daily. (Patient not taking: Reported on 07/14/2023) 30 tablet 0   No current facility-administered medications for this visit.    Allergies as of 07/14/2023   (No Known Allergies)    Family History  Problem Relation Age of Onset   Heart disease Father        Congestive Heart Failure   Prostate cancer Father    Colon cancer Father    Colon polyps Father    Breast cancer Paternal Aunt    Hypertension Mother    Colon polyps Mother    Diabetes Paternal Grandmother    Esophageal cancer Neg Hx    Rectal cancer Neg Hx    Stomach cancer Neg Hx     Social History  Socioeconomic History   Marital status: Divorced    Spouse name: Not on file   Number of children: 0   Years of education: Not on file   Highest education level: Not on file  Occupational History   Occupation: IT trainer: [employer]  Tobacco Use   Smoking status: Never   Smokeless tobacco: Never  Vaping Use   Vaping status: Never Used  Substance and Sexual Activity   Alcohol use: Yes    Comment: ocassionally   Drug use: No   Sexual activity: Not on file  Other Topics Concern   Not on file  Social History Narrative   Not on file   Social Drivers of Health   Financial Resource  Strain: Not on file  Food Insecurity: Not on file  Transportation Needs: Not on file  Physical Activity: Not on file  Stress: Not on file  Social Connections: Not on file  Intimate Partner Violence: Not on file    Review of Systems:    Constitutional: No weight loss, fever, chills, weakness or fatigue HEENT: Eyes: No change in vision               Ears, Nose, Throat:  No change in hearing or congestion Skin: No rash or itching Cardiovascular: No chest pain, chest pressure or palpitations   Respiratory: No SOB or cough Gastrointestinal: See HPI and otherwise negative Genitourinary: No dysuria or change in urinary frequency Neurological: No headache, dizziness or syncope Musculoskeletal: No new muscle or joint pain Hematologic: No bleeding or bruising Psychiatric: No history of depression or anxiety    Physical Exam:  Vital signs: BP (!) 140/74   Pulse 90   Ht 5\' 9"  (1.753 m)   Wt 241 lb (109.3 kg)   SpO2 99%   BMI 35.59 kg/m   Constitutional: NAD, Well developed, Well nourished, alert and cooperative Head:  Normocephalic and atraumatic. Eyes:   PEERL, EOMI. No icterus. Conjunctiva pink. Respiratory: Respirations even and unlabored. Lungs clear to auscultation bilaterally.   No wheezes, crackles, or rhonchi.  Cardiovascular:  Regular rate and rhythm. No peripheral edema, cyanosis or pallor.  Gastrointestinal:  Soft, nondistended, nontender. No rebound or guarding. Normal bowel sounds. No appreciable masses or hepatomegaly. Rectal:  Not performed.  Msk:  Symmetrical without gross deformities. Without edema, no deformity or joint abnormality.  Neurologic:  Alert and  oriented x4;  grossly normal neurologically.  Skin:   Dry and intact without significant lesions or rashes. Psychiatric: Oriented to person, place and time. Demonstrates good judgement and reason without abnormal affect or behaviors.    RELEVANT LABS AND IMAGING: CBC    Component Value Date/Time   WBC 8.7  02/26/2016 1659   RBC 4.52 02/26/2016 1659   HGB 14.4 02/26/2016 1659   HCT 42.8 02/26/2016 1659   PLT 208.0 02/26/2016 1659   MCV 94.8 02/26/2016 1659   MCHC 33.6 02/26/2016 1659   RDW 12.9 02/26/2016 1659   LYMPHSABS 1.1 02/26/2016 1659   MONOABS 0.9 02/26/2016 1659   EOSABS 0.0 02/26/2016 1659   BASOSABS 0.0 02/26/2016 1659    CMP  No results found for: "NA", "K", "CL", "CO2", "GLUCOSE", "BUN", "CREATININE", "CALCIUM", "PROT", "ALBUMIN", "AST", "ALT", "ALKPHOS", "BILITOT", "GFRNONAA", "GFRAA"   Assessment/Plan:      GERD Dysphagia Worsening GERD symptoms not currently on antacid therapy and taking meloxicam 3-4 times per week.  Previous endoscopy normal.  DDx includes esophagitis, gastritis, PUD, globus sensation - Prescribe omeprazole 20 mg  once daily. - Provide educational materials on GERD management. - Advise lifestyle modifications: avoid eating 3 hours before lying down, avoid trigger foods. - avoid NSAIDS if possible - Offered EGD for further evaluation, patient politely declined and would like to try medication first and if no improvement he is amenable to EGD.  Reassuringly had normal EGD in 2019 - Follow-up 6 to 8 weeks  Colonoscopy screening Scheduled due to family history of colon cancer. Previous colonoscopy 2019 showed inflammatory polyp and hemorrhoids.  - Schedule colonoscopy with Dr. Chales Abrahams. - I thoroughly discussed the procedure with the patient (at bedside) to include nature of the procedure, alternatives, benefits, and risks (including but not limited to bleeding, infection, perforation, anesthesia/cardiac pulmonary complications).  Patient verbalized understanding and gave verbal consent to proceed with procedure.      Lara Mulch [facility] 07/14/2023, 9:56 AM  Cc: Simone Curia, MD

## 2023-09-03 ENCOUNTER — Encounter (HOSPITAL_COMMUNITY): Payer: Self-pay

## 2023-09-10 ENCOUNTER — Telehealth: Payer: Self-pay | Admitting: Gastroenterology

## 2023-09-10 ENCOUNTER — Encounter: Admitting: Gastroenterology

## 2023-09-10 NOTE — Telephone Encounter (Signed)
 No problems. Please reschedule him when it is good for him RG

## 2023-09-10 NOTE — Telephone Encounter (Signed)
 Hi Dr Venice Gillis,   I called patient regarding his procedure, he stated, he has family emergency and he is unable to come, I will no show patient for today's procedure .  Thank you

## 2024-01-24 ENCOUNTER — Other Ambulatory Visit: Payer: Self-pay | Admitting: Gastroenterology

## 2024-04-18 ENCOUNTER — Encounter

## 2024-04-18 VITALS — Ht 69.0 in | Wt 220.0 lb

## 2024-04-18 DIAGNOSIS — Z800 Family history of malignant neoplasm of digestive organs: Secondary | ICD-10-CM

## 2024-04-18 DIAGNOSIS — Z83719 Family history of colon polyps, unspecified: Secondary | ICD-10-CM

## 2024-04-18 NOTE — Progress Notes (Signed)

## 2024-05-03 ENCOUNTER — Encounter: Admitting: Gastroenterology

## 2024-05-10 ENCOUNTER — Other Ambulatory Visit: Payer: Self-pay | Admitting: Gastroenterology
